# Patient Record
Sex: Female | Born: 1960 | Race: Black or African American | Hispanic: No | Marital: Married | Smoking: Current some day smoker
Health system: Southern US, Community
[De-identification: ages and names within clinical notes are randomized; demographics above are authoritative.]

## PROBLEM LIST (undated history)

## (undated) DIAGNOSIS — F329 Major depressive disorder, single episode, unspecified: Secondary | ICD-10-CM

## (undated) DIAGNOSIS — G629 Polyneuropathy, unspecified: Secondary | ICD-10-CM

## (undated) DIAGNOSIS — E119 Type 2 diabetes mellitus without complications: Secondary | ICD-10-CM

## (undated) DIAGNOSIS — F32A Depression, unspecified: Secondary | ICD-10-CM

## (undated) DIAGNOSIS — F419 Anxiety disorder, unspecified: Secondary | ICD-10-CM

## (undated) HISTORY — PX: BREAST LUMPECTOMY: SHX2

---

## 2011-08-07 ENCOUNTER — Other Ambulatory Visit (HOSPITAL_COMMUNITY): Payer: Self-pay | Admitting: Physician Assistant

## 2011-08-07 DIAGNOSIS — Z1231 Encounter for screening mammogram for malignant neoplasm of breast: Secondary | ICD-10-CM

## 2011-09-04 ENCOUNTER — Ambulatory Visit (HOSPITAL_COMMUNITY)
Admission: RE | Admit: 2011-09-04 | Discharge: 2011-09-04 | Disposition: A | Discharge status: Home / Self Care | Payer: Self-pay | Source: Ambulatory Visit | Attending: Physician Assistant | Admitting: Physician Assistant

## 2011-09-04 DIAGNOSIS — Z1231 Encounter for screening mammogram for malignant neoplasm of breast: Secondary | ICD-10-CM

## 2011-09-06 ENCOUNTER — Other Ambulatory Visit: Payer: Self-pay | Admitting: Physician Assistant

## 2011-09-06 DIAGNOSIS — R928 Other abnormal and inconclusive findings on diagnostic imaging of breast: Secondary | ICD-10-CM

## 2011-09-19 ENCOUNTER — Other Ambulatory Visit (HOSPITAL_COMMUNITY): Payer: Self-pay | Admitting: Family Medicine

## 2011-09-19 DIAGNOSIS — R928 Other abnormal and inconclusive findings on diagnostic imaging of breast: Secondary | ICD-10-CM

## 2011-09-26 ENCOUNTER — Other Ambulatory Visit (HOSPITAL_COMMUNITY): Payer: Self-pay | Admitting: Family Medicine

## 2011-09-26 ENCOUNTER — Ambulatory Visit (HOSPITAL_COMMUNITY)
Admission: RE | Admit: 2011-09-26 | Discharge: 2011-09-26 | Disposition: A | Discharge status: Home / Self Care | Payer: PRIVATE HEALTH INSURANCE | Source: Ambulatory Visit | Attending: Family Medicine | Admitting: Family Medicine

## 2011-09-26 ENCOUNTER — Ambulatory Visit (HOSPITAL_COMMUNITY)
Admission: RE | Admit: 2011-09-26 | Discharge: 2011-09-26 | Disposition: A | Discharge status: Home / Self Care | Payer: PRIVATE HEALTH INSURANCE | Source: Ambulatory Visit | Attending: Physician Assistant | Admitting: Physician Assistant

## 2011-09-26 DIAGNOSIS — R928 Other abnormal and inconclusive findings on diagnostic imaging of breast: Secondary | ICD-10-CM

## 2011-09-26 DIAGNOSIS — N63 Unspecified lump in unspecified breast: Secondary | ICD-10-CM | POA: Insufficient documentation

## 2012-03-07 ENCOUNTER — Other Ambulatory Visit (HOSPITAL_COMMUNITY): Payer: Self-pay | Admitting: Nurse Practitioner

## 2012-03-07 DIAGNOSIS — Z09 Encounter for follow-up examination after completed treatment for conditions other than malignant neoplasm: Secondary | ICD-10-CM

## 2012-04-02 ENCOUNTER — Ambulatory Visit (HOSPITAL_COMMUNITY)
Admission: RE | Admit: 2012-04-02 | Discharge: 2012-04-02 | Disposition: A | Discharge status: Home / Self Care | Payer: PRIVATE HEALTH INSURANCE | Source: Ambulatory Visit | Attending: Nurse Practitioner | Admitting: Nurse Practitioner

## 2012-04-02 DIAGNOSIS — Z09 Encounter for follow-up examination after completed treatment for conditions other than malignant neoplasm: Secondary | ICD-10-CM

## 2012-04-02 DIAGNOSIS — N63 Unspecified lump in unspecified breast: Secondary | ICD-10-CM | POA: Insufficient documentation

## 2012-12-10 IMAGING — US US BREAST*R*
1 series · 4 of 4 positions shown · non-contrast
Comparison: baseline screening mammogram 09/04/2011

CLINICAL DATA: Mass right breast identified on recent baseline
screening mammogram.

DIGITAL DIAGNOSTIC RIGHT MAMMOGRAM WITHOUT CAD AND RIGHT BREAST
ULTRASOUND:

[Series 1: us breast*right* · 0.08mm/px · 4 of 4 slices shown]
[im 1/4]
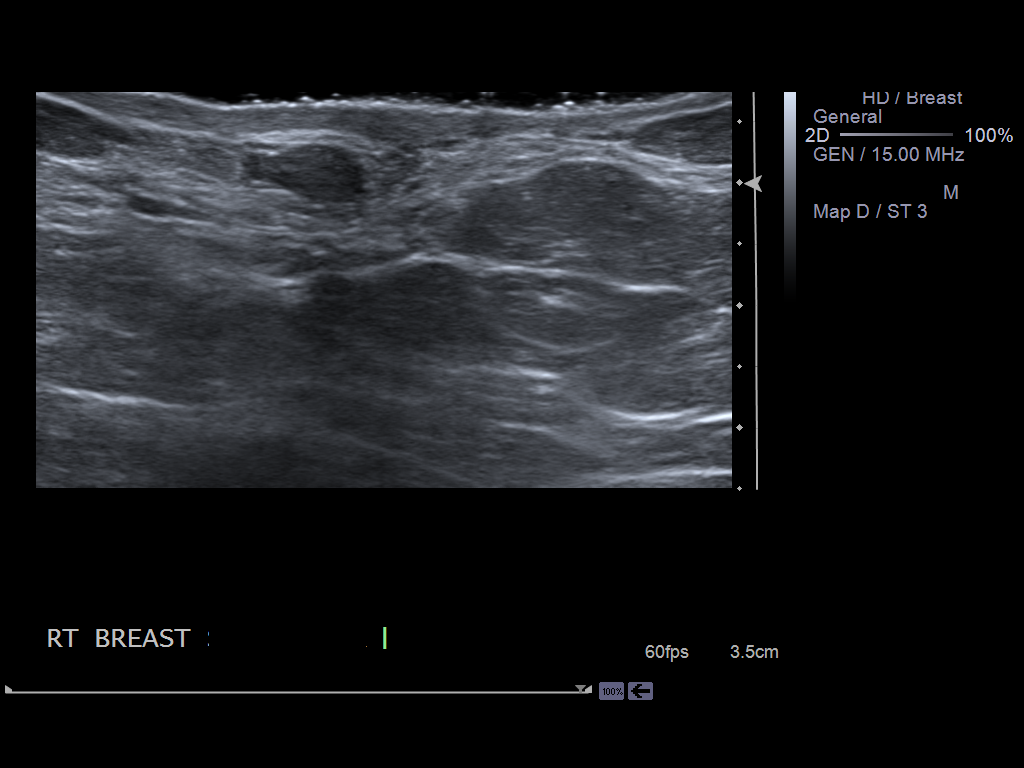
[im 2/4]
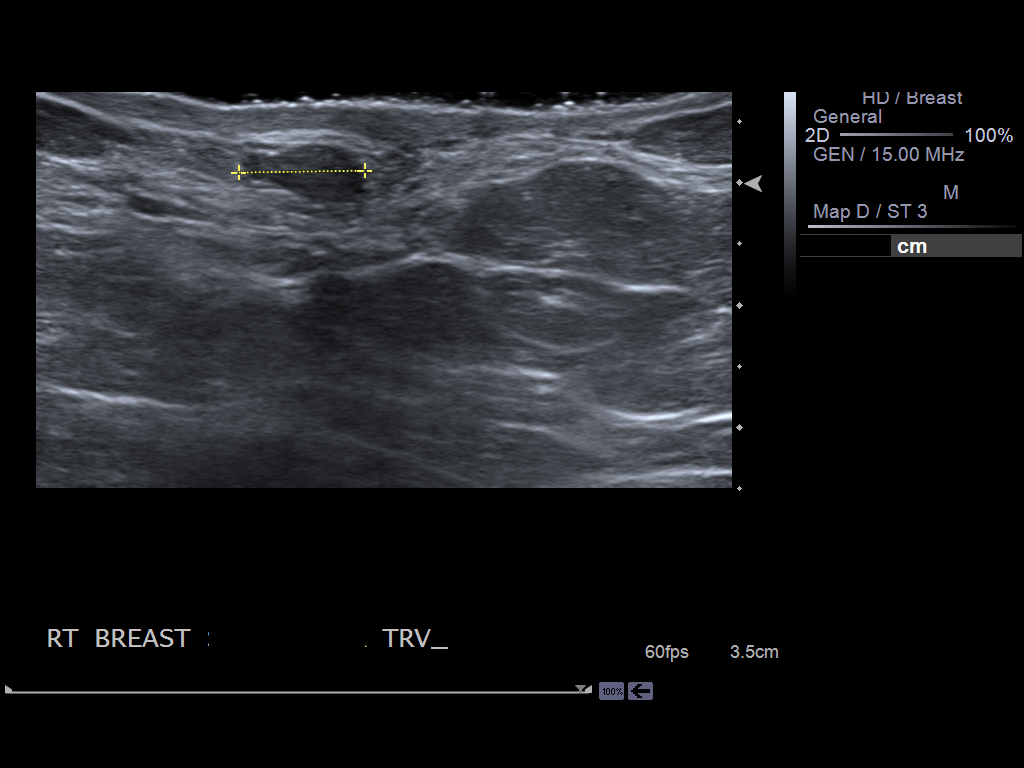
[im 3/4]
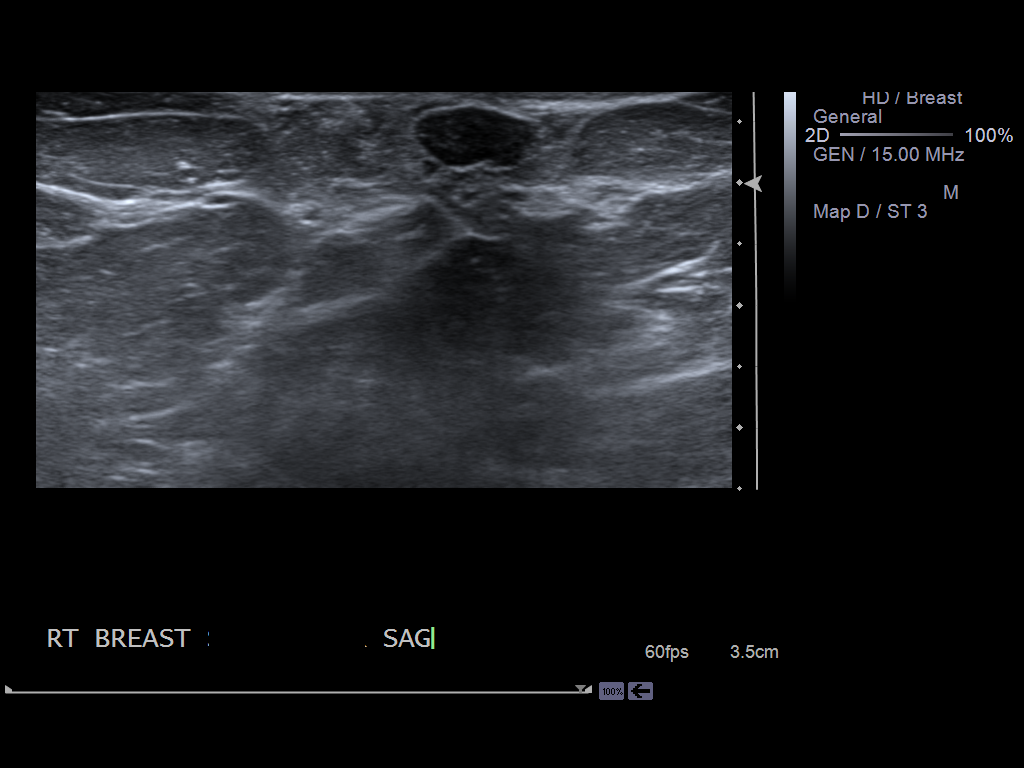
[im 4/4]
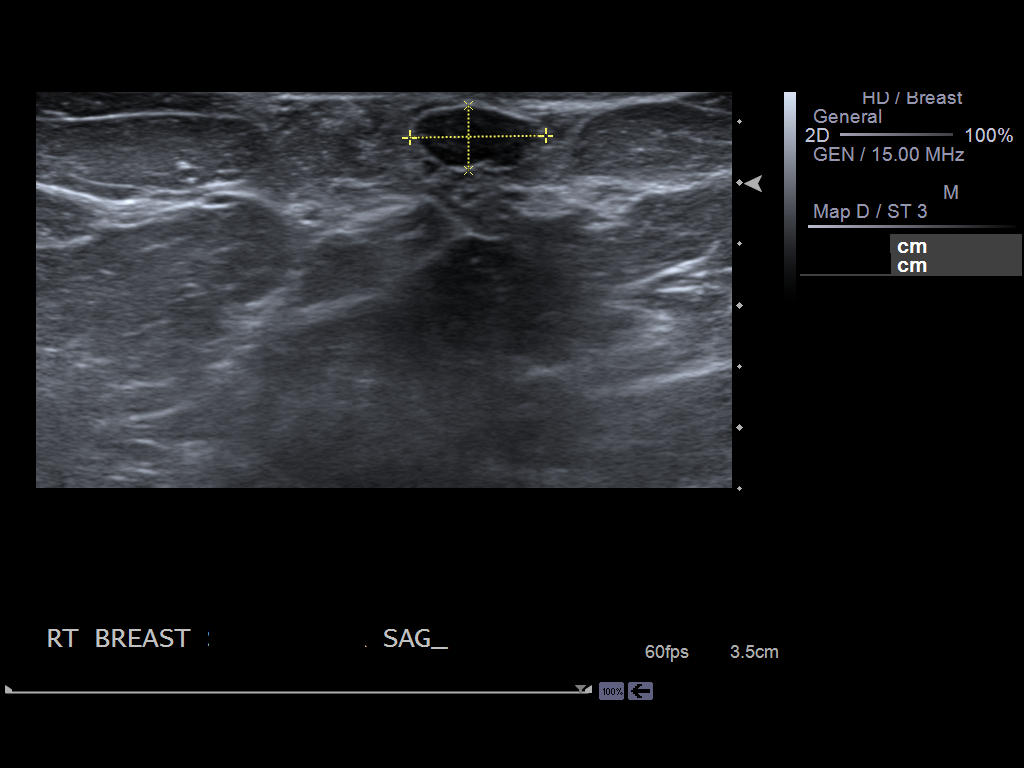

[4 of 4 positions shown; findings below may reference images not displayed]

FINDINGS: Focal spot compression views of the subareolar right
breast show a circumscribed 10 mm nodule.

On physical exam, no mass is palpated in the subareolar right
breast.

Ultrasound is performed, showing a circumscribed oval and
macrolobulated subareolar hypoechoic solid breast mass that
measures 1.0 x 1.1 x 0.5 cm.  This is likely a benign fibroadenoma.
IMPRESSION: Probable fibroadenoma subareolar right breast.  Follow-up right
breast ultrasound in 6 months is recommended.

BI-RADS CATEGORY 3:  Probably benign finding(s) - short interval
follow-up suggested.

## 2014-03-12 ENCOUNTER — Encounter (HOSPITAL_COMMUNITY): Payer: Self-pay | Admitting: Emergency Medicine

## 2014-03-12 ENCOUNTER — Emergency Department (HOSPITAL_COMMUNITY)
Admission: EM | Admit: 2014-03-12 | Discharge: 2014-03-13 | Disposition: A | Discharge status: Other Acute Inpatient Hospital | Payer: Self-pay | Attending: Emergency Medicine | Admitting: Emergency Medicine

## 2014-03-12 DIAGNOSIS — Z8669 Personal history of other diseases of the nervous system and sense organs: Secondary | ICD-10-CM | POA: Insufficient documentation

## 2014-03-12 DIAGNOSIS — F329 Major depressive disorder, single episode, unspecified: Secondary | ICD-10-CM | POA: Insufficient documentation

## 2014-03-12 DIAGNOSIS — Z720 Tobacco use: Secondary | ICD-10-CM | POA: Insufficient documentation

## 2014-03-12 DIAGNOSIS — F32A Depression, unspecified: Secondary | ICD-10-CM

## 2014-03-12 DIAGNOSIS — E119 Type 2 diabetes mellitus without complications: Secondary | ICD-10-CM | POA: Insufficient documentation

## 2014-03-12 HISTORY — DX: Anxiety disorder, unspecified: F41.9

## 2014-03-12 HISTORY — DX: Depression, unspecified: F32.A

## 2014-03-12 HISTORY — DX: Major depressive disorder, single episode, unspecified: F32.9

## 2014-03-12 HISTORY — DX: Polyneuropathy, unspecified: G62.9

## 2014-03-12 HISTORY — DX: Type 2 diabetes mellitus without complications: E11.9

## 2014-03-12 LAB — RAPID URINE DRUG SCREEN, HOSP PERFORMED
Amphetamines: NOT DETECTED
Barbiturates: NOT DETECTED
Benzodiazepines: NOT DETECTED
Cocaine: NOT DETECTED
Opiates: NOT DETECTED
Tetrahydrocannabinol: NOT DETECTED

## 2014-03-12 LAB — BASIC METABOLIC PANEL
Anion gap: 21 — ABNORMAL HIGH (ref 5–15)
BUN: 13 mg/dL (ref 6–23)
CO2: 20 mEq/L (ref 19–32)
Calcium: 9.7 mg/dL (ref 8.4–10.5)
Chloride: 95 mEq/L — ABNORMAL LOW (ref 96–112)
Creatinine, Ser: 0.95 mg/dL (ref 0.50–1.10)
GFR calc Af Amer: 77 mL/min — ABNORMAL LOW (ref >90)
GFR calc non Af Amer: 66 mL/min — ABNORMAL LOW (ref >90)
Glucose, Bld: 71 mg/dL (ref 70–99)
Potassium: 3.4 mEq/L — ABNORMAL LOW (ref 3.7–5.3)
Sodium: 136 mEq/L — ABNORMAL LOW (ref 137–147)

## 2014-03-12 LAB — CBC WITH DIFFERENTIAL/PLATELET
Basophils Absolute: 0.1 10*3/uL (ref 0.0–0.1)
Basophils Relative: 0 % (ref 0–1)
Eosinophils Absolute: 0.1 10*3/uL (ref 0.0–0.7)
Eosinophils Relative: 1 % (ref 0–5)
HCT: 43 % (ref 36.0–46.0)
Hemoglobin: 14.9 g/dL (ref 12.0–15.0)
Lymphocytes Relative: 44 % (ref 12–46)
Lymphs Abs: 6.2 10*3/uL — ABNORMAL HIGH (ref 0.7–4.0)
MCH: 34.7 pg — ABNORMAL HIGH (ref 26.0–34.0)
MCHC: 34.7 g/dL (ref 30.0–36.0)
MCV: 100.2 fL — ABNORMAL HIGH (ref 78.0–100.0)
Monocytes Absolute: 0.7 10*3/uL (ref 0.1–1.0)
Monocytes Relative: 5 % (ref 3–12)
Neutro Abs: 6.9 10*3/uL (ref 1.7–7.7)
Neutrophils Relative %: 49 % (ref 43–77)
Platelets: 230 10*3/uL (ref 150–400)
RBC: 4.29 MIL/uL (ref 3.87–5.11)
RDW: 14.4 % (ref 11.5–15.5)
WBC: 13.9 10*3/uL — ABNORMAL HIGH (ref 4.0–10.5)

## 2014-03-12 LAB — URINALYSIS, ROUTINE W REFLEX MICROSCOPIC
Bilirubin Urine: NEGATIVE
Glucose, UA: NEGATIVE mg/dL
Ketones, ur: NEGATIVE mg/dL
Nitrite: NEGATIVE
Protein, ur: NEGATIVE mg/dL
Specific Gravity, Urine: 1.01 (ref 1.005–1.030)
Urobilinogen, UA: 0.2 mg/dL (ref 0.0–1.0)
pH: 5.5 (ref 5.0–8.0)

## 2014-03-12 LAB — URINE MICROSCOPIC-ADD ON

## 2014-03-12 LAB — ETHANOL: Alcohol, Ethyl (B): 197 mg/dL — ABNORMAL HIGH (ref 0–11)

## 2014-03-12 MED ORDER — ACETAMINOPHEN 325 MG PO TABS
650.0000 mg | ORAL_TABLET | ORAL | Status: DC | PRN
  Administered 2014-03-13: 650 mg via ORAL
  Filled 2014-03-12: qty 2

## 2014-03-12 MED ORDER — ONDANSETRON HCL 4 MG PO TABS: 4.0000 mg | ORAL_TABLET | Freq: Three times a day (TID) | ORAL | Status: DC | PRN

## 2014-03-12 MED ORDER — ALUM & MAG HYDROXIDE-SIMETH 200-200-20 MG/5ML PO SUSP: 30.0000 mL | ORAL | Status: DC | PRN

## 2014-03-12 MED ORDER — NICOTINE 21 MG/24HR TD PT24
21.0000 mg | MEDICATED_PATCH | Freq: Every day | TRANSDERMAL | Status: DC
  Administered 2014-03-12 – 2014-03-13 (×2): 21 mg via TRANSDERMAL
  Filled 2014-03-12 (×2): qty 1

## 2014-03-12 MED ORDER — IBUPROFEN 400 MG PO TABS: 400.0000 mg | ORAL_TABLET | Freq: Three times a day (TID) | ORAL | Status: DC | PRN

## 2014-03-12 MED ORDER — LORAZEPAM 1 MG PO TABS
1.0000 mg | ORAL_TABLET | Freq: Three times a day (TID) | ORAL | Status: DC | PRN
  Administered 2014-03-12: 1 mg via ORAL
  Filled 2014-03-12: qty 1

## 2014-03-12 NOTE — ED Provider Notes (Signed)
CSN: [id number]     Arrival date & time 03/12/14  2058 History   First MD Initiated Contact with Patient 03/12/14 2115     Chief Complaint  Patient presents with  . V70.1     HPI Pt was seen at 2120.  Per pt, c/o gradual onset and worsening of persistent depression for the past several months, worse over the past several days. Pt states she had thoughts of harming herself tonight but denies having a plan. States she has been taking meds, as well as been evaluated at [facility], for depression/anxiety. Denies HI, no hallucinations.   Past Medical History  Diagnosis Date  . Diabetes mellitus without complication   . Neuropathy   . Depression   . Anxiety    History reviewed. No pertinent past surgical history.  History  Substance Use Topics  . Smoking status: Current Every Day Smoker  . Smokeless tobacco: Not on file  . Alcohol Use: Yes    Review of Systems ROS: Statement: All systems negative except as marked or noted in the HPI; Constitutional: Negative for fever and chills. ; ; Eyes: Negative for eye pain, redness and discharge. ; ; ENMT: Negative for ear pain, hoarseness, nasal congestion, sinus pressure and sore throat. ; ; Cardiovascular: Negative for chest pain, palpitations, diaphoresis, dyspnea and peripheral edema. ; ; Respiratory: Negative for cough, wheezing and stridor. ; ; Gastrointestinal: Negative for nausea, vomiting, diarrhea, abdominal pain, blood in stool, hematemesis, jaundice and rectal bleeding. . ; ; Genitourinary: Negative for dysuria, flank pain and hematuria. ; ; Musculoskeletal: Negative for back pain and neck pain. Negative for swelling and trauma.; ; Skin: Negative for pruritus, rash, abrasions, blisters, bruising and skin lesion.; ; Neuro: Negative for headache, lightheadedness and neck stiffness. Negative for weakness, altered level of consciousness , altered mental status, extremity weakness, paresthesias, involuntary movement, seizure and syncope.; Psych:   +depression, vague SI. No SA, no HI, no hallucinations.     Allergies  Review of patient's allergies indicates no known allergies.  Home Medications   Prior to Admission medications   Not on File   Pulse 108  Temp(Src) 98.2 F (36.8 C) (Oral)  Resp 24  Ht 5\' 6"  (1.676 m)  Wt 214 lb (97.07 kg)  BMI 34.56 kg/m2  SpO2 98% Physical Exam 2125:  Physical examination:  Nursing notes reviewed; Vital signs and O2 SAT reviewed;  Constitutional: Well developed, Well nourished, Well hydrated, In no acute distress; Head:  Normocephalic, atraumatic; Eyes: EOMI, PERRL, No scleral icterus; ENMT: Mouth and pharynx normal, Mucous membranes moist; Neck: Supple, Full range of motion, No lymphadenopathy; Cardiovascular: Regular rate and rhythm, No murmur, rub, or gallop; Respiratory: Breath sounds clear & equal bilaterally, No rales, rhonchi, wheezes.  Speaking full sentences with ease, Normal respiratory effort/excursion; Chest: Nontender, Movement normal; Abdomen: Soft, Nontender, Nondistended, Normal bowel sounds;; Extremities: Pulses normal, No tenderness, No edema, No calf edema or asymmetry.; Neuro: AA&Ox3, Major CN grossly intact.  Speech clear. No gross focal motor or sensory deficits in extremities. Climbs on and off stretcher easily by herself. Gait steady.; Skin: Color normal, Warm, Dry.; Psych:  Affect flat, poor eye contact, rambling historian.    ED Course  Procedures   MDM  MDM Reviewed: previous chart, nursing note and vitals    2155:  Labs and TTS eval pending. Holding orders written.   Francine Graven, DO 03/12/14 2155

## 2014-03-12 NOTE — BH Assessment (Signed)
Received call for assessment. Spoke with Dr. Nat Christen who said Pt reports depression with suicidal ideation and no plan. Tele-assessment will be initiated. Nursing staff reports Pt needs to be relocated and will be ready in approximately 10 minutes.  Orpah Greek Rosana Hoes, [facility] Triage Specialist [phone]
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
## 2014-03-12 NOTE — ED Notes (Signed)
Pt states she is depressed and tired of feeling this way pt has had thoughts of harming herself tonight but states she doesn't have a plan. Pt states she is tired of the "drama" in her life. Pt has been drinking tonight.

## 2014-03-12 NOTE — BH Assessment (Signed)
Assessment complete. Per Lavell Luster, [facility], there is not an appropriate bed available at this time but a bed will be available in the morning. Gave clinical report to Waylan Boga, NP who agrees Pt meets criteria for inpatient treatment and accepts Pt to the service of Dr. Ursula Alert. Notified Dr. Nat Christen of acceptance and bed situation. Notified Ivin Booty, RN of disposition.  Orpah Greek Rosana Hoes, [facility] Triage Specialist [phone]

## 2014-03-13 ENCOUNTER — Encounter (HOSPITAL_COMMUNITY): Payer: Self-pay | Admitting: *Deleted

## 2014-03-13 ENCOUNTER — Inpatient Hospital Stay (HOSPITAL_COMMUNITY)
Admission: AD | Admit: 2014-03-13 | Discharge: 2014-03-16 | DRG: 885 | Disposition: A | Discharge status: Home / Self Care | Payer: 59 | Source: Intra-hospital | Attending: Psychiatry | Admitting: Psychiatry

## 2014-03-13 DIAGNOSIS — F1010 Alcohol abuse, uncomplicated: Secondary | ICD-10-CM | POA: Diagnosis present

## 2014-03-13 DIAGNOSIS — E119 Type 2 diabetes mellitus without complications: Secondary | ICD-10-CM | POA: Diagnosis present

## 2014-03-13 DIAGNOSIS — F332 Major depressive disorder, recurrent severe without psychotic features: Secondary | ICD-10-CM | POA: Diagnosis present

## 2014-03-13 DIAGNOSIS — F4310 Post-traumatic stress disorder, unspecified: Secondary | ICD-10-CM | POA: Diagnosis present

## 2014-03-13 DIAGNOSIS — I10 Essential (primary) hypertension: Secondary | ICD-10-CM | POA: Diagnosis present

## 2014-03-13 DIAGNOSIS — Z599 Problem related to housing and economic circumstances, unspecified: Secondary | ICD-10-CM

## 2014-03-13 DIAGNOSIS — F17210 Nicotine dependence, cigarettes, uncomplicated: Secondary | ICD-10-CM | POA: Diagnosis present

## 2014-03-13 DIAGNOSIS — R45851 Suicidal ideations: Secondary | ICD-10-CM | POA: Diagnosis present

## 2014-03-13 DIAGNOSIS — Z559 Problems related to education and literacy, unspecified: Secondary | ICD-10-CM | POA: Diagnosis present

## 2014-03-13 DIAGNOSIS — G4700 Insomnia, unspecified: Secondary | ICD-10-CM | POA: Diagnosis present

## 2014-03-13 DIAGNOSIS — F330 Major depressive disorder, recurrent, mild: Secondary | ICD-10-CM

## 2014-03-13 DIAGNOSIS — F17200 Nicotine dependence, unspecified, uncomplicated: Secondary | ICD-10-CM

## 2014-03-13 DIAGNOSIS — G629 Polyneuropathy, unspecified: Secondary | ICD-10-CM | POA: Diagnosis present

## 2014-03-13 DIAGNOSIS — N390 Urinary tract infection, site not specified: Secondary | ICD-10-CM | POA: Diagnosis present

## 2014-03-13 DIAGNOSIS — Z91410 Personal history of adult physical and sexual abuse: Secondary | ICD-10-CM

## 2014-03-13 DIAGNOSIS — F329 Major depressive disorder, single episode, unspecified: Secondary | ICD-10-CM | POA: Diagnosis present

## 2014-03-13 DIAGNOSIS — F32A Depression, unspecified: Secondary | ICD-10-CM | POA: Diagnosis present

## 2014-03-13 DIAGNOSIS — F411 Generalized anxiety disorder: Secondary | ICD-10-CM | POA: Diagnosis present

## 2014-03-13 DIAGNOSIS — G8929 Other chronic pain: Secondary | ICD-10-CM | POA: Diagnosis present

## 2014-03-13 LAB — CBG MONITORING, ED
Glucose-Capillary: 100 mg/dL — ABNORMAL HIGH (ref 70–99)
Glucose-Capillary: 149 mg/dL — ABNORMAL HIGH (ref 70–99)

## 2014-03-13 LAB — GLUCOSE, CAPILLARY
Glucose-Capillary: 162 mg/dL — ABNORMAL HIGH (ref 70–99)
Glucose-Capillary: 118 mg/dL — ABNORMAL HIGH (ref 70–99)
Glucose-Capillary: 158 mg/dL — ABNORMAL HIGH (ref 70–99)

## 2014-03-13 MED ORDER — VITAMIN B-1 100 MG PO TABS
100.0000 mg | ORAL_TABLET | Freq: Every day | ORAL | Status: DC
  Administered 2014-03-14 – 2014-03-16 (×3): 100 mg via ORAL
  Filled 2014-03-13 (×5): qty 1

## 2014-03-13 MED ORDER — ROSUVASTATIN CALCIUM 20 MG PO TABS
20.0000 mg | ORAL_TABLET | Freq: Every day | ORAL | Status: DC
  Administered 2014-03-13 – 2014-03-15 (×3): 20 mg via ORAL
  Filled 2014-03-13 (×6): qty 1

## 2014-03-13 MED ORDER — ASPIRIN EC 325 MG PO TBEC
325.0000 mg | DELAYED_RELEASE_TABLET | Freq: Every day | ORAL | Status: DC
Start: 2014-03-13 — End: 2014-03-13
  Administered 2014-03-13: 325 mg via ORAL
  Filled 2014-03-13: qty 1

## 2014-03-13 MED ORDER — CEPHALEXIN 500 MG PO CAPS
500.0000 mg | ORAL_CAPSULE | Freq: Two times a day (BID) | ORAL | Status: DC
  Administered 2014-03-13 (×2): 500 mg via ORAL
  Filled 2014-03-13 (×2): qty 1

## 2014-03-13 MED ORDER — CHLORDIAZEPOXIDE HCL 25 MG PO CAPS: 25.0000 mg | ORAL_CAPSULE | Freq: Four times a day (QID) | ORAL | Status: AC | PRN

## 2014-03-13 MED ORDER — INSULIN GLARGINE 100 UNIT/ML ~~LOC~~ SOLN
20.0000 [IU] | Freq: Every day | SUBCUTANEOUS | Status: DC
  Administered 2014-03-13 – 2014-03-15 (×3): 20 [IU] via SUBCUTANEOUS

## 2014-03-13 MED ORDER — METFORMIN HCL 500 MG PO TABS
1000.0000 mg | ORAL_TABLET | Freq: Two times a day (BID) | ORAL | Status: DC
  Administered 2014-03-13 – 2014-03-16 (×6): 1000 mg via ORAL
  Filled 2014-03-13 (×10): qty 2

## 2014-03-13 MED ORDER — ONDANSETRON 4 MG PO TBDP: 4.0000 mg | ORAL_TABLET | Freq: Four times a day (QID) | ORAL | Status: AC | PRN

## 2014-03-13 MED ORDER — GLIPIZIDE ER 10 MG PO TB24
10.0000 mg | ORAL_TABLET | Freq: Every day | ORAL | Status: DC
  Administered 2014-03-14 – 2014-03-16 (×3): 10 mg via ORAL
  Filled 2014-03-13 (×5): qty 1

## 2014-03-13 MED ORDER — HYDROCHLOROTHIAZIDE 25 MG PO TABS
25.0000 mg | ORAL_TABLET | Freq: Every day | ORAL | Status: DC
  Filled 2014-03-13 (×2): qty 1

## 2014-03-13 MED ORDER — GABAPENTIN 400 MG PO CAPS
1200.0000 mg | ORAL_CAPSULE | Freq: Two times a day (BID) | ORAL | Status: DC
  Administered 2014-03-13 – 2014-03-16 (×6): 1200 mg via ORAL
  Filled 2014-03-13 (×10): qty 3

## 2014-03-13 MED ORDER — ROSUVASTATIN CALCIUM 20 MG PO TABS
20.0000 mg | ORAL_TABLET | Freq: Every day | ORAL | Status: DC
  Filled 2014-03-13: qty 1

## 2014-03-13 MED ORDER — HYDROCHLOROTHIAZIDE 25 MG PO TABS
25.0000 mg | ORAL_TABLET | Freq: Every day | ORAL | Status: DC
  Administered 2014-03-14 – 2014-03-16 (×3): 25 mg via ORAL
  Filled 2014-03-13 (×5): qty 1

## 2014-03-13 MED ORDER — ASPIRIN EC 325 MG PO TBEC
325.0000 mg | DELAYED_RELEASE_TABLET | Freq: Every day | ORAL | Status: DC
  Administered 2014-03-14 – 2014-03-16 (×3): 325 mg via ORAL
  Filled 2014-03-13 (×5): qty 1

## 2014-03-13 MED ORDER — CIPROFLOXACIN HCL 500 MG PO TABS
500.0000 mg | ORAL_TABLET | Freq: Two times a day (BID) | ORAL | Status: DC
  Administered 2014-03-13 – 2014-03-16 (×7): 500 mg via ORAL
  Filled 2014-03-13 (×10): qty 1
  Filled 2014-03-13: qty 2

## 2014-03-13 MED ORDER — MAGNESIUM HYDROXIDE 400 MG/5ML PO SUSP: 30.0000 mL | Freq: Every day | ORAL | Status: DC | PRN

## 2014-03-13 MED ORDER — ALUM & MAG HYDROXIDE-SIMETH 200-200-20 MG/5ML PO SUSP: 30.0000 mL | ORAL | Status: DC | PRN

## 2014-03-13 MED ORDER — LORAZEPAM 1 MG PO TABS
2.0000 mg | ORAL_TABLET | Freq: Once | ORAL | Status: AC
  Administered 2014-03-13: 2 mg via ORAL
  Filled 2014-03-13: qty 2

## 2014-03-13 MED ORDER — ACETAMINOPHEN 325 MG PO TABS: 650.0000 mg | ORAL_TABLET | Freq: Four times a day (QID) | ORAL | Status: DC | PRN

## 2014-03-13 MED ORDER — HYDROXYZINE HCL 25 MG PO TABS: 25.0000 mg | ORAL_TABLET | Freq: Four times a day (QID) | ORAL | Status: AC | PRN

## 2014-03-13 MED ORDER — NICOTINE POLACRILEX 2 MG MT GUM
2.0000 mg | CHEWING_GUM | OROMUCOSAL | Status: DC | PRN
  Filled 2014-03-13: qty 1

## 2014-03-13 MED ORDER — METOPROLOL TARTRATE 25 MG PO TABS
25.0000 mg | ORAL_TABLET | Freq: Two times a day (BID) | ORAL | Status: DC
  Administered 2014-03-13 – 2014-03-16 (×6): 25 mg via ORAL
  Filled 2014-03-13 (×10): qty 1

## 2014-03-13 MED ORDER — LOPERAMIDE HCL 2 MG PO CAPS: 2.0000 mg | ORAL_CAPSULE | ORAL | Status: AC | PRN

## 2014-03-13 MED ORDER — METFORMIN HCL 500 MG PO TABS
1000.0000 mg | ORAL_TABLET | Freq: Two times a day (BID) | ORAL | Status: DC
  Administered 2014-03-13: 1000 mg via ORAL
  Filled 2014-03-13 (×2): qty 2

## 2014-03-13 MED ORDER — METOPROLOL TARTRATE 25 MG PO TABS
25.0000 mg | ORAL_TABLET | Freq: Two times a day (BID) | ORAL | Status: DC
  Administered 2014-03-13: 25 mg via ORAL
  Filled 2014-03-13: qty 1

## 2014-03-13 MED ORDER — ACETAMINOPHEN 325 MG PO TABS
650.0000 mg | ORAL_TABLET | Freq: Four times a day (QID) | ORAL | Status: DC | PRN
Start: 2014-03-13 — End: 2014-03-13

## 2014-03-13 MED ORDER — ADULT MULTIVITAMIN W/MINERALS CH
1.0000 | ORAL_TABLET | Freq: Every day | ORAL | Status: DC
  Administered 2014-03-13 – 2014-03-16 (×4): 1 via ORAL
  Filled 2014-03-13 (×2): qty 1
  Filled 2014-03-13: qty 14
  Filled 2014-03-13 (×4): qty 1

## 2014-03-13 NOTE — Plan of Care (Signed)
Problem: Diagnosis: Increased Risk For Suicide Attempt Goal: STG-Patient Will Comply With Medication Regime Outcome: Progressing Patient is compliant with scheduled medications.     
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
## 2014-03-13 NOTE — ED Notes (Signed)
Destination on discharge charted in error. Pt did not go home/self care. Pt transferred to [facility].

## 2014-03-13 NOTE — [facility] Group Notes (Signed)
[facility] Group Notes:  (Nursing/MHT/Case Management/Adjunct)  Date:  03/13/2014  Time: 0900 am  Type of Therapy:  Psychoeducational Skills  Participation Level:  Did Not Attend   Natalie Vaughan 03/13/2014, 3:16 PM

## 2014-03-13 NOTE — Progress Notes (Signed)
[facility] Group Notes:  (Nursing/MHT/Case Management/Adjunct)  Date:  03/13/2014  Time:  9:50 PM  Type of Therapy:  Psychoeducational Skills  Participation Level:  Active  Participation Quality:  Appropriate  Affect:  Flat  Cognitive:  Appropriate  Insight:  Appropriate  Engagement in Group:  Supportive  Modes of Intervention:  Education  Summary of Progress/Problems: The patient shared in group that today was her first day in the hospital and that she was able to call her husband. She mentioned briefly that her husband is currently in the [place] But would not expound any further. As a theme for the day,her coping skill is to go fishing and to cook.   Wynonia Medero S 03/13/2014, 9:50 PM

## 2014-03-13 NOTE — ED Notes (Signed)
Per 8am bridge call, pt has a bed that is being cleaned. Pt going to 505-2 at [facility]. Attempted to call report several times with no answer.

## 2014-03-13 NOTE — Tx Team (Signed)
Initial Interdisciplinary Treatment Plan   PATIENT STRESSORS: Financial difficulties Substance abuse   PROBLEM LIST: Problem List/Patient Goals Date to be addressed Date deferred Reason deferred Estimated date of resolution  Substance abuse 03/13/14           Depression 03/13/14                                          DISCHARGE CRITERIA:  Improved stabilization in mood, thinking, and/or behavior Need for constant or close observation no longer present  PRELIMINARY DISCHARGE PLAN: Return to previous living arrangement  PATIENT/FAMIILY INVOLVEMENT: This treatment plan has been presented to and reviewed with the patient, Natalie Vaughan, and/or family member.  The patient and family have been given the opportunity to ask questions and make suggestions.  [name] 03/13/2014, 11:55 AM

## 2014-03-13 NOTE — Plan of Care (Signed)
Problem: Diagnosis: Increased Risk For Suicide Attempt Goal: STG-Patient Will Attend All Groups On The Unit Outcome: Progressing Patient attended wrap up group this evening.

## 2014-03-13 NOTE — ED Notes (Signed)
Verbal report given to Vira Browns, RN. [place]. All questions answered.

## 2014-03-13 NOTE — ED Notes (Signed)
[facility] transport here. Pt in bathroom, washing face and performing ADL's. nad noted.

## 2014-03-13 NOTE — Progress Notes (Signed)
Writer observed patient sitting in the dayroom watching tv with no interaction with peers. Writer introduced self to her and informed her of her medications scheduled for tonight. Patient voiced no complaints and is agreeable to taking her scheduled medications. She currently denies si/hi/a/v hallucinations. Support and encouragement given, safety maintained on unit with 15 min checks.

## 2014-03-13 NOTE — ED Provider Notes (Signed)
Patient accepted by [facility]. Patient is on a involuntary hold. Excepting physician is Eappen.  Fredia Sorrow, MD 03/13/14 [phone]

## 2014-03-14 DIAGNOSIS — F4310 Post-traumatic stress disorder, unspecified: Secondary | ICD-10-CM

## 2014-03-14 DIAGNOSIS — F332 Major depressive disorder, recurrent severe without psychotic features: Principal | ICD-10-CM

## 2014-03-14 DIAGNOSIS — F1010 Alcohol abuse, uncomplicated: Secondary | ICD-10-CM

## 2014-03-14 LAB — GLUCOSE, CAPILLARY
Glucose-Capillary: 148 mg/dL — ABNORMAL HIGH (ref 70–99)
Glucose-Capillary: 161 mg/dL — ABNORMAL HIGH (ref 70–99)
Glucose-Capillary: 172 mg/dL — ABNORMAL HIGH (ref 70–99)
Glucose-Capillary: 170 mg/dL — ABNORMAL HIGH (ref 70–99)

## 2014-03-14 MED ORDER — NICOTINE 21 MG/24HR TD PT24
21.0000 mg | MEDICATED_PATCH | Freq: Every day | TRANSDERMAL | Status: DC
  Administered 2014-03-14 – 2014-03-16 (×3): 21 mg via TRANSDERMAL
  Filled 2014-03-14 (×4): qty 1
  Filled 2014-03-14: qty 14

## 2014-03-14 MED ORDER — NICOTINE 21 MG/24HR TD PT24
MEDICATED_PATCH | TRANSDERMAL | Status: AC
  Administered 2014-03-14: 21 mg via TRANSDERMAL
  Filled 2014-03-14: qty 1

## 2014-03-14 NOTE — Progress Notes (Signed)
Patient ID: Natalie Vaughan, female   DOB: 1960-12-18, 52 y.o.   MRN: [id number] D. Pt presents with depressed mood, affect congruent. [name] reports that she is ''feeling better now that I am changing things to get better. '' Pt completed self inventory and rates her depression 2/10 on depression scale. She denies any si. She did request to sign 72 hour stating '' i want to be sure I can get out of here in three days. i don't want to be stuck here. A. Medications given as ordered. Support encouragement provided. Pt signed 72 hr form. R. Patient is safe. No further voiced concerns. Will continue to monitor q 15 minutes for safety.
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
## 2014-03-14 NOTE — BHH Group Notes (Signed)
[facility] Group Notes:  Self inventory  Date:  03/14/2014  Time:  10:24 AM  Type of Therapy:  Nurse Education  Participation Level:  Active  Participation Quality:  Appropriate  Affect:  Appropriate  Cognitive:  Alert  Insight:  Appropriate  Engagement in Group:  Engaged  Modes of Intervention:  Discussion  Summary of Progress/Problems:  Delman Kitten 03/14/2014, 10:24 AM

## 2014-03-14 NOTE — BHH Group Notes (Signed)
[place] Group Notes:  (Clinical Social Work)  03/14/2014   11:15am-12:00pm  Summary of Progress/Problems:  The main focus of today's process group was to listen to a variety of genres of music and to identify that different types of music provoke different responses.  The patient then was able to identify personally what was soothing for them, as well as energizing.   The patient expressed understanding of concepts, as well as knowledge of how each type of music affected her.  She seemed to be enjoying the music pieces, but did leave the group prior to the end and did not return.  She stated her prevailing feeling at the beginning of group was Depression, at 3 out of 10.  Type of Therapy:  Music Therapy   Participation Level:  Active  Participation Quality:  Attentive and Sharing  Affect:  Blunted  Cognitive:  Oriented  Insight:  Engaged  Engagement in Therapy:  Engaged  Modes of Intervention:   Activity, Exploration  Selmer Dominion, LCSW 03/14/2014, 12:30pm
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
## 2014-03-14 NOTE — BHH Suicide Risk Assessment (Signed)
Suicide Risk Assessment  Admission Assessment     Nursing information obtained from:  Patient Demographic factors:  Low socioeconomic status Current Mental Status:  Suicidal ideation indicated by patient Loss Factors:  Financial problems / change in socioeconomic status Historical Factors:  Prior suicide attempts Risk Reduction Factors:  Sense of responsibility to family Total Time spent with patient: 1 hour  CLINICAL FACTORS:   Depression:   Anhedonia Comorbid alcohol abuse/dependence Hopelessness Impulsivity Dysthymia Alcohol/Substance Abuse/Dependencies  Psychiatric Specialty Exam:     Blood pressure 114/73, pulse 88, temperature 98.4 F (36.9 C), temperature source Oral, resp. rate 16, height 5\' 6"  (1.676 m), weight 215 lb (97.523 kg).Body mass index is 34.72 kg/(m^2).  General Appearance: Casual  Eye Contact::  Fair  Speech:  Slow  Volume:  Decreased  Mood:  Dysphoric  Affect:  Congruent  Thought Process:  Coherent  Orientation:  Full (Time, Place, and Person)  Thought Content:  Rumination  Suicidal Thoughts:  Yes.  without intent/plan  Homicidal Thoughts:  No  Memory:  Immediate;   Fair Recent;   Fair  Judgement:  Poor  Insight:  Shallow  Psychomotor Activity:  Decreased  Concentration:  Fair  Recall:  [name]: Fair  Akathisia:  Negative  Handed:  Right  AIMS (if indicated):     Assets:  Desire for Improvement Leisure Time  Sleep:  Number of Hours: 6.25   Musculoskeletal: Strength & Muscle Tone: within normal limits Gait & Station: normal Patient leans: N/A  COGNITIVE FEATURES THAT CONTRIBUTE TO RISK:  Closed-mindedness Polarized thinking    SUICIDE RISK:   Moderate:  Frequent suicidal ideation with limited intensity, and duration, some specificity in terms of plans, no associated intent, good self-control, limited dysphoria/symptomatology, some risk factors present, and identifiable protective factors, including  available and accessible social support.  PLAN OF CARE:  I certify that inpatient services furnished can reasonably be expected to improve the patient's condition.  Natalie Vaughan 03/14/2014, 10:12 AM

## 2014-03-14 NOTE — Plan of Care (Signed)
Problem: Diagnosis: Increased Risk For Suicide Attempt Goal: STG-Patient Will Attend All Groups On The Unit Outcome: Progressing Patient attended group this evening Goal: STG-Patient Will Comply With Medication Regime Outcome: Progressing Patient compliant with medications

## 2014-03-14 NOTE — [facility] Group Notes (Signed)
[facility] Group Notes:  Healthy support systmes  Date:  03/14/2014  Time:  10:00 AM  Type of Therapy:  Nurse Education  Participation Level:  Active  Participation Quality:  Appropriate  Affect:  Appropriate  Cognitive:  Alert  Insight:  Appropriate  Engagement in Group:  Engaged  Modes of Intervention:  Discussion  Summary of Progress/Problems:Pt stated she is in a better frame of mind today. Her support system is her sister.  Marcello Moores Guyes 03/14/2014, 10:00 AM

## 2014-03-14 NOTE — Progress Notes (Signed)
Writer observed patient sitting in the dayroom watching tv with no interaction with peers. Writer spoke with her 1:1 and she reports she feels much better and the increase in her neurontin dosage has helped with her pain. She reports that she felt that she needed a break from everything going on in her life right now which she thinks has improved how she feels now. She denies si/hi/a/v hallucinations. Support and encouragement given, safety maintained on  Unit wth 15 min checks.

## 2014-03-14 NOTE — Progress Notes (Signed)
[facility] Group Notes:  (Nursing/MHT/Case Management/Adjunct)  Date:  03/14/2014  Time:  10:24 PM  Type of Therapy:  Psychoeducational Skills  Participation Level:  Active  Participation Quality:  Appropriate  Affect:  Flat  Cognitive:  Appropriate  Insight:  Appropriate  Engagement in Group:  Engaged  Modes of Intervention:  Education  Summary of Progress/Problems: The patient described her day as having been "so so". The patient does not have any discharge plans in place at this time. No further details have been provided.   Archie Balboa S 03/14/2014, 10:24 PM

## 2014-03-15 DIAGNOSIS — F411 Generalized anxiety disorder: Secondary | ICD-10-CM

## 2014-03-15 DIAGNOSIS — Z720 Tobacco use: Secondary | ICD-10-CM

## 2014-03-15 LAB — GLUCOSE, CAPILLARY
Glucose-Capillary: 152 mg/dL — ABNORMAL HIGH (ref 70–99)
Glucose-Capillary: 208 mg/dL — ABNORMAL HIGH (ref 70–99)
Glucose-Capillary: 192 mg/dL — ABNORMAL HIGH (ref 70–99)
Glucose-Capillary: 183 mg/dL — ABNORMAL HIGH (ref 70–99)

## 2014-03-15 MED ORDER — GLUCERNA SHAKE PO LIQD
237.0000 mL | Freq: Three times a day (TID) | ORAL | Status: DC
  Administered 2014-03-15: 237 mL via ORAL

## 2014-03-15 MED ORDER — TRAZODONE HCL 50 MG PO TABS
50.0000 mg | ORAL_TABLET | Freq: Every evening | ORAL | Status: DC | PRN
  Administered 2014-03-15: 50 mg via ORAL
  Filled 2014-03-15: qty 14
  Filled 2014-03-15: qty 1

## 2014-03-15 MED ORDER — INSULIN ASPART 100 UNIT/ML ~~LOC~~ SOLN: 0.0000 [IU] | Freq: Every day | SUBCUTANEOUS | Status: DC

## 2014-03-15 MED ORDER — POTASSIUM CHLORIDE CRYS ER 20 MEQ PO TBCR
20.0000 meq | EXTENDED_RELEASE_TABLET | Freq: Every day | ORAL | Status: DC
  Administered 2014-03-15 – 2014-03-16 (×2): 20 meq via ORAL
  Filled 2014-03-15 (×5): qty 1

## 2014-03-15 MED ORDER — INSULIN ASPART 100 UNIT/ML ~~LOC~~ SOLN
0.0000 [IU] | Freq: Three times a day (TID) | SUBCUTANEOUS | Status: DC
  Administered 2014-03-15 – 2014-03-16 (×3): 3 [IU] via SUBCUTANEOUS

## 2014-03-15 MED ORDER — SERTRALINE HCL 25 MG PO TABS
25.0000 mg | ORAL_TABLET | Freq: Every day | ORAL | Status: DC
  Administered 2014-03-15 – 2014-03-16 (×2): 25 mg via ORAL
  Filled 2014-03-15: qty 14
  Filled 2014-03-15 (×5): qty 1
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
## 2014-03-15 NOTE — [facility] Group Notes (Signed)
[facility] LCSW Group Therapy  03/15/2014 1:15 pm  Type of Therapy: Process Group Therapy  Participation Level:  Active  Participation Quality:  Appropriate  Affect:  Flat  Cognitive:  Oriented  Insight:  Improving  Engagement in Group:  Limited  Engagement in Therapy:  Limited  Modes of Intervention:  Activity, Clarification, Education, Problem-solving and Support  Summary of Progress/Problems: Today's group addressed the issue of overcoming obstacles.  Patients were asked to identify their biggest obstacle post d/c that stands in the way of their on-going success, and then problem solve as to how to manage this.  "I was feeling like my back was against the wall.  I was worried about finances and it got me down.  But there was really nothing I could do about it.  I just need to live for one day at a time and not worry so much about the future."  Also talked about how she is finding it helpful to be on the nicotene patch, and her hope that she can quit smoking, "which will give me $5 more a day to use on bills."  Roque Lias B 03/15/2014   3:32 PM

## 2014-03-15 NOTE — Progress Notes (Signed)
Patient ID: Natalie Vaughan, female   DOB: 1960/08/24, 53 y.o.   MRN: [id number]  D: Pt. Denies SI/HI and A/V Hallucinations to this Probation officer. Patient does report chronic neuropathic pain in her legs and received scheduled neurontin for this. Patient rates depression, anxiety, and hopelessness at 0/10 for the day. Patient reports she slept good, her appetite is good, energy level is normal, and concentration level is good today.   A: Support and encouragement provided to the patient to come to Probation officer with questions or concerns. Scheduled medications administered to patient per physician's orders.  R: Patient is receptive and cooperative. Patient is seen in the milieu and is participating in groups. Patient reports that she is ready to go home soon. Q15 minute checks are maintained for safety.

## 2014-03-15 NOTE — Progress Notes (Signed)
Inpatient Diabetes Program Recommendations  AACE/ADA: New Consensus Statement on Inpatient Glycemic Control (2013)  Target Ranges:  Prepandial:   less than 140 mg/dL      Peak postprandial:   less than 180 mg/dL (1-2 hours)      Critically ill patients:  140 - 180 mg/dL   Reason for Visit: Diabetes Consult  Diabetes history: DM2 Outpatient Diabetes medications: Lantus 20 units QHS, metformin 1000 mg bid and glipizide 10 mg QAM. Current orders for Inpatient glycemic control: Same as above  Results for YARAH, FUENTE (MRN [id number]) as of 03/15/2014 12:33  Ref. Range 03/14/2014 12:00 03/14/2014 16:51 03/14/2014 20:54 03/15/2014 06:19 03/15/2014 11:28  Glucose-Capillary Latest Range: 70-99 mg/dL 161 (H) 172 (H) 170 (H) 152 (H) 208 (H)  Results for KYNZEE, DEVINNEY (MRN [id number]) as of 03/15/2014 12:33  Ref. Range 03/12/2014 21:32  Sodium Latest Range: 137-147 mEq/L 136 (L)  Potassium Latest Range: 3.7-5.3 mEq/L 3.4 (L)  Chloride Latest Range: 96-112 mEq/L 95 (L)  CO2 Latest Range: 19-32 mEq/L 20  BUN Latest Range: 6-23 mg/dL 13  Creatinine Latest Range: 0.50-1.10 mg/dL 0.95  Calcium Latest Range: 8.4-10.5 mg/dL 9.7  GFR calc non Af Amer Latest Range: >90 mL/min 66 (L)  GFR calc Af Amer Latest Range: >90 mL/min 77 (L)  Glucose Latest Range: 70-99 mg/dL 71  Anion gap Latest Range: 5-15  21 (H)    Inpatient Diabetes Program Recommendations Insulin - Basal: On home dose of Lantus 20 units QHS Correction (SSI): Consider addition of Novolog moderate tidwc and hs Oral Agents: On home dose of metformin 1000 mg bid HgbA1C: Check HgbA1C to assess glycemic control prior to hospitalization  Note: Encourage pt to make healthy choices using portion control, in the cafeteria.  Will continue to follow. Thank you. Lorenda Peck, RD, LDN, CDE Inpatient Diabetes Coordinator [phone]

## 2014-03-15 NOTE — Progress Notes (Signed)
The focus of this group is to help patients review their daily goal of treatment and discuss progress on daily workbooks. Pt attended the evening group session and responded to all discussion prompts from the [place] [employer]. Pt shared that today was a good day on the unit, the highlight of which was talking to her sister and husband on the phone. Pt told the group that upon discharge she planned to stay well by not taking on the problems of other people. "I need to be careful who I surround myself with so I don't get dragged down." Pt's affect was appropriate.

## 2014-03-15 NOTE — [facility] Suicide Risk Assessment (Signed)
[facility] INPATIENT:  Family/Significant Other Suicide Prevention Education  Suicide Prevention Education:  Education Completed; Amrie Gurganus (pt's husband) [phone] has been identified by the patient as the family member/significant other with whom the patient will be residing, and identified as the person(s) who will aid the patient in the event of a mental health crisis (suicidal ideations/suicide attempt).  With written consent from the patient, the family member/significant other has been provided the following suicide prevention education, prior to the and/or following the discharge of the patient.  The suicide prevention education provided includes the following:  Suicide risk factors  Suicide prevention and interventions  National Suicide Hotline telephone number  [facility] assessment telephone number  [facility] Emergency Assistance [place] and/or Residential Mobile Crisis Unit telephone number  Request made of family/significant other to:  Remove weapons (e.g., guns, rifles, knives), all items previously/currently identified as safety concern.    Remove drugs/medications (over-the-counter, prescriptions, illicit drugs), all items previously/currently identified as a safety concern.  The family member/significant other verbalizes understanding of the suicide prevention education information provided.  The family member/significant other agrees to remove the items of safety concern listed above.  Smart, Ky Rumple LCSWA  03/15/2014, 2:18 PM

## 2014-03-15 NOTE — [facility] Group Notes (Signed)
[facility] LCSW Aftercare Discharge Planning Group Note   03/15/2014 10:52 AM  Participation Quality:  Appropriate   Mood/Affect:  Appropriate  Depression Rating:  1  Anxiety Rating:  1  Thoughts of Suicide:  No Will you contract for safety?   NA  Current AVH:  No  Plan for Discharge/Comments:  Pt reports that she brought herself to the hospital in order to get "my head straight." PT reports some stressors surrounding medical issues that caused her to stop working. Pt stated that she no longer feels depressed/no SI. Pt minimizes alcohol consumption and reports that she does not SA IOP. Pt plans to return home at d/c and will follow up with [facility] for med management (Dr. Junius Roads). CSW providing pt with comprehensive AA list for [facility].   Transportation Means: [facility]y P[facility] will need to be contacted by CSW to transport her [facility] at d/c. From there, her sister will pick her up and bring her home.   Supports: Primary school teacher, Research officer, trade union

## 2014-03-16 LAB — COMPREHENSIVE METABOLIC PANEL
ALT: 31 U/L (ref 0–35)
AST: 30 U/L (ref 0–37)
Albumin: 3.8 g/dL (ref 3.5–5.2)
Alkaline Phosphatase: 82 U/L (ref 39–117)
Anion gap: 14 (ref 5–15)
BUN: 15 mg/dL (ref 6–23)
CO2: 25 mEq/L (ref 19–32)
Calcium: 10 mg/dL (ref 8.4–10.5)
Chloride: 102 mEq/L (ref 96–112)
Creatinine, Ser: 0.87 mg/dL (ref 0.50–1.10)
GFR calc Af Amer: 85 mL/min — ABNORMAL LOW (ref >90)
GFR calc non Af Amer: 74 mL/min — ABNORMAL LOW (ref >90)
Glucose, Bld: 162 mg/dL — ABNORMAL HIGH (ref 70–99)
Potassium: 4 mEq/L (ref 3.7–5.3)
Sodium: 141 mEq/L (ref 137–147)
Total Bilirubin: 0.3 mg/dL (ref 0.3–1.2)
Total Protein: 7.5 g/dL (ref 6.0–8.3)

## 2014-03-16 LAB — HEMOGLOBIN A1C
Hgb A1c MFr Bld: 6.1 % — ABNORMAL HIGH (ref <5.7)
Mean Plasma Glucose: 128 mg/dL — ABNORMAL HIGH (ref <117)

## 2014-03-16 LAB — GLUCOSE, CAPILLARY
Glucose-Capillary: 165 mg/dL — ABNORMAL HIGH (ref 70–99)
Glucose-Capillary: 156 mg/dL — ABNORMAL HIGH (ref 70–99)

## 2014-03-16 LAB — TSH: TSH: 3.13 u[IU]/mL (ref 0.350–4.500)

## 2014-03-16 MED ORDER — TRAZODONE HCL 50 MG PO TABS: 50.0000 mg | ORAL_TABLET | Freq: Every evening | ORAL | Status: DC | PRN

## 2014-03-16 MED ORDER — SERTRALINE HCL 25 MG PO TABS: 25.0000 mg | ORAL_TABLET | Freq: Every day | ORAL | Status: DC

## 2014-03-16 MED ORDER — HYDROCHLOROTHIAZIDE 25 MG PO TABS: 25.0000 mg | ORAL_TABLET | Freq: Every day | ORAL | Status: DC

## 2014-03-16 MED ORDER — ASPIRIN EC 325 MG PO TBEC: 325.0000 mg | DELAYED_RELEASE_TABLET | Freq: Every day | ORAL | Status: AC

## 2014-03-16 MED ORDER — ROSUVASTATIN CALCIUM 20 MG PO TABS: 20.0000 mg | ORAL_TABLET | Freq: Every day | ORAL | Status: DC

## 2014-03-16 MED ORDER — GLIPIZIDE ER 10 MG PO TB24: 10.0000 mg | ORAL_TABLET | Freq: Every day | ORAL | Status: DC

## 2014-03-16 MED ORDER — ADULT MULTIVITAMIN W/MINERALS CH: 1.0000 | ORAL_TABLET | Freq: Every day | ORAL | Status: DC

## 2014-03-16 MED ORDER — METOPROLOL TARTRATE 25 MG PO TABS: 25.0000 mg | ORAL_TABLET | Freq: Two times a day (BID) | ORAL | Status: DC

## 2014-03-16 MED ORDER — GABAPENTIN 400 MG PO CAPS: 1200.0000 mg | ORAL_CAPSULE | Freq: Two times a day (BID) | ORAL | Status: DC

## 2014-03-16 MED ORDER — INSULIN GLARGINE 100 UNIT/ML SOLOSTAR PEN: 20.0000 [IU] | PEN_INJECTOR | Freq: Every day | SUBCUTANEOUS | Status: AC

## 2014-03-16 MED ORDER — GABAPENTIN 600 MG PO TABS
1200.0000 mg | ORAL_TABLET | Freq: Two times a day (BID) | ORAL | Status: DC
  Filled 2014-03-16: qty 56

## 2014-03-16 MED ORDER — METFORMIN HCL 1000 MG PO TABS: 1000.0000 mg | ORAL_TABLET | Freq: Two times a day (BID) | ORAL | Status: DC

## 2014-03-16 NOTE — Progress Notes (Signed)
D: Pt presents flat in affect and depressed in mood. Pt was guarded in interaction with this Probation officer. Pt is currently denying any SI/HI. Pt also denied any AVH. "I'm done with all of that" (in reference to [place]). Pt actively participated in group this evening. Pt had no concerns she wished for this writer to address at this time.  A: Writer administered scheduled medications to pt, per MD orders. Writer also verbalized the indications and onset times of her Novolog and Lantus. Continued support and availability as needed was extended to this pt. Staff continue to monitor pt with q26min checks.  R: No adverse drug reactions noted. Pt receptive to treatment. Pt remains safe at this time.

## 2014-03-16 NOTE — [facility] Group Notes (Signed)
[facility] Group Notes:  (Nursing/MHT/Case Management/Adjunct)  Date:  03/16/2014  Time:  9:39 AM  Type of Therapy:  Nurse Education    Participation Level:  Minimal  Participation Quality:  Present but did not participate.  Affect:  Blunted  Cognitive:  Alert and Oriented  Insight:  None  Engagement in Group:  Poor  Modes of Intervention:  Discussion, Orientation and Support  Summary of Progress/Problems:  Charlyne Quale A 03/16/2014, 9:39 AM

## 2014-03-16 NOTE — [facility] Group Notes (Signed)
[facility] LCSW Group Therapy  03/16/2014 , 1:44 PM   Type of Therapy:  Group Therapy  Participation Level:  Active  Participation Quality:  Attentive  Affect:  Appropriate  Cognitive:  Alert  Insight:  Improving  Engagement in Therapy:  Engaged  Modes of Intervention:  Discussion, Exploration and Socialization  Summary of Progress/Problems: Today's group focused on the term Diagnosis.  Participants were asked to define the term, and then pronounce whether it is a negative, positive or neutral term.  Natalie Vaughan was more focused on symptoms than diagnosis.  She identified her isolation previous to admission, and how that was detrimental to her well being.  "But I got so tired of being asked for help by people, I just needed to shut them out."  She identifies her sister as a positive support who does not ask of her, and is planning on spending more time with her.  She was challenged by the group to find more creative outlets for her talents.  They experience her as a giving, people person who could volunteer at places where others need encouragement.  Roque Lias B 03/16/2014 , 1:44 PM

## 2014-03-16 NOTE — Progress Notes (Signed)
DISCHARGE NOTE D: Patient alert, oriented, and in stable condition upon discharge. Pt denies SI/HI and AVH. A: AVS reviewed with and given to pt. Medications and prescriptions given to pt. Follow up reviewed with pt. Pt was given time to express concerns and questions. Belongings returned to pt. Community resources discussed with pt. R: Pt D/C'd with a steady gait to a Public affairs consultant.

## 2014-03-16 NOTE — [facility] Suicide Risk Assessment (Signed)
   Demographic Factors:  Low socioeconomic status and Unemployed  Total Time spent with patient: 45 minutes  Psychiatric Specialty Exam: Physical Exam  Constitutional: She is oriented to person, place, and time. She appears well-developed and well-nourished.  HENT:  Head: Normocephalic and atraumatic.  Eyes: EOM are normal.  Neck: Normal range of motion. Neck supple.  Respiratory: Effort normal.  GI: Soft.  Musculoskeletal: Normal range of motion.  Neurological: She is alert and oriented to person, place, and time.  Skin: Skin is warm.  Psychiatric: She has a normal mood and affect. Her behavior is normal. Judgment and thought content normal.    Review of Systems  Constitutional: Negative.   HENT: Negative.   Eyes: Negative.   Respiratory: Negative.   Cardiovascular: Negative.   Gastrointestinal: Negative.   Genitourinary: Negative.   Musculoskeletal: Negative.   Skin: Negative.   Neurological: Negative.   Psychiatric/Behavioral: Positive for substance abuse. Negative for depression, suicidal ideas and hallucinations. The patient is not nervous/anxious and does not have insomnia.     Blood pressure 125/70, pulse 99, temperature 98.7 F (37.1 C), temperature source Oral, resp. rate 20, height 5\' 6"  (1.676 m), weight 97.523 kg (215 lb).Body mass index is 34.72 kg/(m^2).  General Appearance: Casual  Eye Contact::  Fair  Speech:  Clear and Coherent  Volume:  Normal  Mood:  Euphoric  Affect:  Congruent  Thought Process:  Coherent  Orientation:  Full (Time, Place, and Person)  Thought Content:  WDL  Suicidal Thoughts:  No  Homicidal Thoughts:  No  Memory:  Immediate;   Fair Recent;   Fair Remote;   Fair  Judgement:  Fair  Insight:  Fair  Psychomotor Activity:  Normal  Concentration:  Fair  Recall:  [employer] of Knowledge:Fair  Language: Good  Akathisia:  No  Handed:  Right  AIMS (if indicated):     Assets:  Communication Skills Desire for  Improvement Housing Intimacy Physical Health Social Support  Sleep:  Number of Hours: 6.75    Musculoskeletal: Strength & Muscle Tone: within normal limits Gait & Station: normal Patient leans: N/A   Mental Status Per Nursing Assessment::   On Admission:  Suicidal ideation indicated by patient  Current Mental Status by Physician: DENIES SI/HI/AH/VH  Loss Factors: NA  Historical Factors: Impulsivity  Risk Reduction Factors:   Religious beliefs about death, Living with another person, especially a relative and Positive social support  Continued Clinical Symptoms:  Alcohol/Substance Abuse/Dependencies  Cognitive Features That Contribute To Risk:  Polarized thinking    Suicide Risk:  Minimal: No identifiable suicidal ideation.    Discharge Diagnoses:  DSM5:  Primary Psychiatric Diagnosis:  Generalized Anxiety disorder (Improving)   Secondary Psychiatric Diagnosis:  Major Depressive disorder,recurrent ,mild Alcohol use disorder,mild  Tobacco use disorder     Non Psychiatric Diagnosis:  Diabetes Mellitus  Chronic back pain  Hypertension  Neuropathy   Past Medical History  Diagnosis Date  . Diabetes mellitus without complication   . Neuropathy   . Depression   . Anxiety     Plan Of Care/Follow-up recommendations:  Activity:   Diet:   Tests:  See DC instructions  Is patient on multiple antipsychotic therapies at discharge:  No   Has Patient had three or more failed trials of antipsychotic monotherapy by history:  No  Recommended Plan for Multiple Antipsychotic Therapies: NA    Saniyyah Elster MD 03/16/2014, 8:39 AM

## 2014-03-16 NOTE — Progress Notes (Signed)
D: Patient alert and oriented. Pt's mood is pleasant and affect is appropriate. Pt denies SI/HI and AVH at this time. Pt states she is ready to go home. Pt is attending groups. Pt reports her goal for the day is to "keep happy thoughts, one day at a time." Pt reports hopelessness, depression, and anxiety 0/10. A: CBG monitored per providers orders (See Doc Flowsheets). Medications administered per providers orders (See MAR). 15 minute checks completed per protocol for patient safety.  R: Pt cooperative and receptive to nursing interventions.
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
## 2014-03-16 NOTE — Progress Notes (Signed)
Adult Psychoeducational Group Note  Date:  03/16/2014 Time:  10:49 AM  Group Topic/Focus:  Therapeutic Activity  Participation Level:  Active  Participation Quality:  Appropriate  Affect:  Appropriate  Cognitive:  Appropriate  Insight: Good  Engagement in Group:  Engaged  Modes of Intervention:  Activity, Discussion and Socialization  Additional Comments: Today's therapeutic activity was Landscape architect. This was a fun way to identify coping skills to use in stressful situations.  Sherlean Foot N 03/16/2014, 10:49 AM

## 2014-03-16 NOTE — Progress Notes (Signed)
NUTRITION ASSESSMENT  RD consulted for diet education  INTERVENTION: 1. Educated patient on the importance of nutrition and encouraged intake of food and beverages. 2. Discussed weight goals. 3. Supplements: Glucerna Shake po TID, each supplement provides 220 kcal and 10 grams of protein  NUTRITION DIAGNOSIS: Food and nutrition related knowledge deficit related to lack of prior diet education as evidenced by pt report.  Goal: Pt to meet >/= 90% of their estimated nutrition needs.  Monitor:  PO intake  Assessment:  53 y.o. Female who presents with symptoms of depression including suicidal ideation.She states she drinks beer and wine daily "to calm my nerves" and Pt's blood alcohol level upon arrival is 197. She reports drinking approximately two quarts of beer daily and denies having withdrawal symptoms when she stops.    Pt saw diabetes coordinator 10/26. When asked about this, pt did not recall this visit. Pt did not recall having diabetes. Pt states  "I don't eat the way I should. I don't eat the diet foods".  Provided pt with "Plate Method" visual handout. Discussed with pt the importance of eating 3 meals a day with snacks, emphasizing protein consumption. Discussed the importance of good nutrition for mental health and aiding in recovery.   Pt is receiving Glucerna Shakes TID but is currently refusing them.  Height: Ht Readings from Last 1 Encounters:  03/13/14 5\' 6"  (1.676 m)    Weight: Wt Readings from Last 1 Encounters:  03/13/14 215 lb (97.523 kg)    Weight Hx: Wt Readings from Last 10 Encounters:  03/13/14 215 lb (97.523 kg)  03/12/14 214 lb (97.07 kg)    BMI:  Body mass index is 34.72 kg/(m^2). Pt meets criteria for obesity based on current BMI.  Estimated Nutritional Needs: Kcal: 25-30 kcal/kg Protein: > 1 gram protein/kg Fluid: 1 ml/kcal  Diet Order: Carb Control Pt is also offered choice of unit snacks mid-morning and mid-afternoon.  Pt is eating as  desired.   Lab results and medications reviewed: Zoloft Glucose 162  [place] [address], [place], [place], [place] Pager: [phone] After Hours Pager: [phone]

## 2014-03-16 NOTE — Progress Notes (Signed)
[facility] Adult Case Management Discharge Plan :  Will you be returning to the same living situation after discharge: Yes,  home with husband At discharge, do you have transportation home?:Yes,  [place] coming at 3:30PM. Pt's sister will pick her up from [facility].  Do you have the ability to pay for your medications:Yes,  Mental health  Release of information consent forms completed and submitted to Medical Records by CSW.  Patient to Follow up at: Follow-up Information   Follow up with Tamela Gammon On 03/19/2014. (Follow-up appt scheduled for this date. Walk in between 7:45AM-11AM to be seen. Referral number: [id number])    Contact information:   [place] [place], [place] [address] Phone: [phone] Fax: [phone]      Patient denies SI/HI:   Yes,  during group/self report.     Safety Planning and Suicide Prevention discussed:  Yes,  SPE completed with pt's husband. SPI pamphlet provided to pt and she was encouraged to share information with support network, ask questions, and talk about any concerns relating to SPE.  Smart, Alauna Hayden LCSWA  03/16/2014, 9:28 AM

## 2014-03-19 NOTE — Progress Notes (Signed)
Patient Discharge Instructions:  After Visit Summary (AVS):   Faxed to:  03/19/14 Discharge Summary Note:   Faxed to:  03/19/14 Psychiatric Admission Assessment Note:   Faxed to:  03/19/14 Suicide Risk Assessment - Discharge Assessment:   Faxed to:  03/19/14 Faxed/Sent to the Next Level Care provider:  03/19/14 Faxed to [facility] [place], 03/19/2014, 3:32 PM

## 2015-04-11 ENCOUNTER — Other Ambulatory Visit: Payer: Self-pay | Admitting: Physician Assistant

## 2015-04-12 ENCOUNTER — Other Ambulatory Visit: Payer: Self-pay | Admitting: Physician Assistant

## 2015-04-12 MED ORDER — ATORVASTATIN CALCIUM 20 MG PO TABS: 20.0000 mg | ORAL_TABLET | Freq: Every day | ORAL | Status: DC

## 2015-04-22 ENCOUNTER — Other Ambulatory Visit: Payer: Self-pay | Admitting: Physician Assistant

## 2015-04-22 LAB — LIPID PANEL
Cholesterol: 151 mg/dL (ref 125–200)
HDL: 51 mg/dL (ref >=46)
LDL Cholesterol: 81 mg/dL (ref <130)
Total CHOL/HDL Ratio: 3 Ratio (ref <=5.0)
Triglycerides: 93 mg/dL (ref <150)
VLDL: 19 mg/dL (ref <30)

## 2015-04-22 LAB — COMPREHENSIVE METABOLIC PANEL
ALT: 20 U/L (ref 6–29)
AST: 22 U/L (ref 10–35)
Albumin: 4.3 g/dL (ref 3.6–5.1)
Alkaline Phosphatase: 66 U/L (ref 33–130)
BUN: 19 mg/dL (ref 7–25)
CO2: 25 mmol/L (ref 20–31)
Calcium: 9.6 mg/dL (ref 8.6–10.4)
Chloride: 108 mmol/L (ref 98–110)
Creat: 0.94 mg/dL (ref 0.50–1.05)
Glucose, Bld: 77 mg/dL (ref 65–99)
Potassium: 4.2 mmol/L (ref 3.5–5.3)
Sodium: 139 mmol/L (ref 135–146)
Total Bilirubin: 0.7 mg/dL (ref 0.2–1.2)
Total Protein: 7.2 g/dL (ref 6.1–8.1)

## 2015-04-22 LAB — HEMOGLOBIN A1C
Hgb A1c MFr Bld: 7.1 % — ABNORMAL HIGH (ref <5.7)
Mean Plasma Glucose: 157 mg/dL — ABNORMAL HIGH (ref <117)

## 2015-04-27 ENCOUNTER — Ambulatory Visit: Payer: Self-pay | Admitting: Physician Assistant

## 2015-04-27 ENCOUNTER — Encounter: Payer: Self-pay | Admitting: Physician Assistant

## 2015-04-27 VITALS — BP 122/70 | HR 91 | Temp 98.1°F | Ht 64.5 in | Wt 207.6 lb

## 2015-04-27 DIAGNOSIS — I10 Essential (primary) hypertension: Secondary | ICD-10-CM

## 2015-04-27 DIAGNOSIS — E1142 Type 2 diabetes mellitus with diabetic polyneuropathy: Secondary | ICD-10-CM

## 2015-04-27 DIAGNOSIS — E785 Hyperlipidemia, unspecified: Secondary | ICD-10-CM

## 2015-04-27 DIAGNOSIS — E118 Type 2 diabetes mellitus with unspecified complications: Secondary | ICD-10-CM

## 2015-04-27 DIAGNOSIS — E669 Obesity, unspecified: Secondary | ICD-10-CM

## 2015-04-27 NOTE — Progress Notes (Signed)
BP 122/70 mmHg  Pulse 91  Temp(Src) 98.1 F (36.7 C)  Ht 5' 4.5" (1.638 m)  Wt 207 lb 9.6 oz (94.167 kg)  BMI 35.10 kg/m2  SpO2 93%   Subjective:    Patient ID: Natalie Vaughan, female    DOB: 09-14-1960, 54 y.o.   MRN: [id number]  HPI: Natalie Vaughan is a 54 y.o. female presenting on 04/27/2015 for Diabetes   HPI Chief Complaint  Patient presents with  . Diabetes    pt states she feels okay, but just has a cold    Relevant past medical, surgical, family and social history reviewed and updated as indicated. Interim medical history since our last visit reviewed. Allergies and medications reviewed and updated.  Current outpatient prescriptions:  .  aspirin EC 325 MG tablet, Take 1 tablet (325 mg total) by mouth daily. (Patient taking differently: Take 81 mg by mouth daily. ), Disp: 30 tablet, Rfl: 0 .  atorvastatin (LIPITOR) 20 MG tablet, Take 1 tablet (20 mg total) by mouth daily., Disp: 90 tablet, Rfl: 2 .  citalopram (CELEXA) 20 MG tablet, Take 20 mg by mouth daily., Disp: , Rfl:  .  gabapentin (NEURONTIN) 300 MG capsule, TAKE 4 Capsules  BY MOUTH TWICE DAILY, Disp: 720 capsule, Rfl: 1 .  glipiZIDE (GLUCOTROL XL) 10 MG 24 hr tablet, Take 1 tablet (10 mg total) by mouth daily with breakfast., Disp: , Rfl:  .  hydrochlorothiazide (HYDRODIURIL) 25 MG tablet, Take 1 tablet (25 mg total) by mouth daily., Disp: , Rfl:  .  Insulin Glargine (LANTUS SOLOSTAR) 100 UNIT/ML Solostar Pen, Inject 20 Units into the skin daily at 10 pm., Disp: 15 mL, Rfl: 11 .  metFORMIN (GLUCOPHAGE) 1000 MG tablet, Take 1 tablet (1,000 mg total) by mouth 2 (two) times daily., Disp: , Rfl:  .  metoprolol tartrate (LOPRESSOR) 25 MG tablet, Take 1 tablet (25 mg total) by mouth 2 (two) times daily., Disp: , Rfl:    Review of Systems  Constitutional: Positive for diaphoresis, fatigue and unexpected weight change. Negative for fever, chills and appetite change.  HENT: Positive for congestion and hearing loss. Negative  for dental problem, drooling, ear pain, facial swelling, mouth sores, sneezing, sore throat, trouble swallowing and voice change.   Eyes: Negative for pain, discharge, redness, itching and visual disturbance.  Respiratory: Positive for shortness of breath and wheezing. Negative for cough and choking.   Cardiovascular: Negative for chest pain, palpitations and leg swelling.  Gastrointestinal: Positive for constipation. Negative for vomiting, abdominal pain, diarrhea and blood in stool.  Endocrine: Negative for cold intolerance, heat intolerance and polydipsia.  Genitourinary: Negative for dysuria, hematuria and decreased urine volume.  Musculoskeletal: Positive for back pain, arthralgias and gait problem.  Skin: Negative for rash.  Allergic/Immunologic: Negative for environmental allergies.  Neurological: Negative for seizures, syncope, light-headedness and headaches.  Hematological: Negative for adenopathy.  Psychiatric/Behavioral: Positive for dysphoric mood. Negative for suicidal ideas and agitation. The patient is nervous/anxious.     Per HPI unless specifically indicated above     Objective:    BP 122/70 mmHg  Pulse 91  Temp(Src) 98.1 F (36.7 C)  Ht 5' 4.5" (1.638 m)  Wt 207 lb 9.6 oz (94.167 kg)  BMI 35.10 kg/m2  SpO2 93%  Wt Readings from Last 3 Encounters:  04/27/15 207 lb 9.6 oz (94.167 kg)  03/13/14 215 lb (97.523 kg)  03/12/14 214 lb (97.07 kg)    Physical Exam  Constitutional: She is oriented to person, place,  and time. She appears well-developed and well-nourished.  HENT:  Head: Normocephalic and atraumatic.  Neck: Neck supple.  Cardiovascular: Normal rate and regular rhythm.   Pulmonary/Chest: Effort normal and breath sounds normal.  Abdominal: Soft. Bowel sounds are normal. She exhibits no mass. There is no tenderness.  Musculoskeletal: She exhibits no edema.  Lymphadenopathy:    She has no cervical adenopathy.  Neurological: She is alert and oriented to  person, place, and time.  Skin: Skin is warm and dry.  Psychiatric: She has a normal mood and affect. Her behavior is normal.  Vitals reviewed.   Results for orders placed or performed in visit on 04/22/15  Comprehensive metabolic panel  Result Value Ref Range   Sodium 139 135 - 146 mmol/L   Potassium 4.2 3.5 - 5.3 mmol/L   Chloride 108 98 - 110 mmol/L   CO2 25 20 - 31 mmol/L   Glucose, Bld 77 65 - 99 mg/dL   BUN 19 7 - 25 mg/dL   Creat 0.94 0.50 - 1.05 mg/dL   Total Bilirubin 0.7 0.2 - 1.2 mg/dL   Alkaline Phosphatase 66 33 - 130 U/L   AST 22 10 - 35 U/L   ALT 20 6 - 29 U/L   Total Protein 7.2 6.1 - 8.1 g/dL   Albumin 4.3 3.6 - 5.1 g/dL   Calcium 9.6 8.6 - 10.4 mg/dL  Lipid panel  Result Value Ref Range   Cholesterol 151 125 - 200 mg/dL   Triglycerides 93 <150 mg/dL   HDL 51 >=46 mg/dL   Total CHOL/HDL Ratio 3.0 <=5.0 Ratio   VLDL 19 <30 mg/dL   LDL Cholesterol 81 <130 mg/dL  Hemoglobin A1c  Result Value Ref Range   Hgb A1c MFr Bld 7.1 (H) <5.7 %   Mean Plasma Glucose 157 (H) <117 mg/dL      Assessment & Plan:   Encounter Diagnoses  Name Primary?  . Type 2 diabetes mellitus with complication, unspecified long term insulin use status ([place]) Yes  . Essential hypertension, benign   . Hyperlipidemia   . Obesity, unspecified   . Diabetic polyneuropathy associated with type 2 diabetes mellitus ([place])     -reviewed labs with pt- much improved from 3 mo ago -Pt has appt for DM eye exam in February -Cont with [facility] for [facility] care -F/u OV 3 mo. rto sooner prn

## 2015-05-04 DIAGNOSIS — E785 Hyperlipidemia, unspecified: Secondary | ICD-10-CM | POA: Insufficient documentation

## 2015-05-04 DIAGNOSIS — E669 Obesity, unspecified: Secondary | ICD-10-CM | POA: Insufficient documentation

## 2015-05-04 DIAGNOSIS — E118 Type 2 diabetes mellitus with unspecified complications: Secondary | ICD-10-CM | POA: Insufficient documentation

## 2015-05-04 DIAGNOSIS — I10 Essential (primary) hypertension: Secondary | ICD-10-CM | POA: Insufficient documentation

## 2015-05-04 DIAGNOSIS — E1142 Type 2 diabetes mellitus with diabetic polyneuropathy: Secondary | ICD-10-CM | POA: Insufficient documentation

## 2015-07-06 ENCOUNTER — Other Ambulatory Visit: Payer: Self-pay | Admitting: Physician Assistant

## 2015-07-06 ENCOUNTER — Encounter: Payer: Self-pay | Admitting: Physician Assistant

## 2015-07-06 ENCOUNTER — Ambulatory Visit: Payer: Self-pay | Admitting: Physician Assistant

## 2015-07-06 VITALS — BP 128/80 | HR 92 | Temp 97.7°F | Ht 64.5 in | Wt 213.0 lb

## 2015-07-06 DIAGNOSIS — M25552 Pain in left hip: Secondary | ICD-10-CM

## 2015-07-06 MED ORDER — GLIPIZIDE 10 MG PO TABS: 10.0000 mg | ORAL_TABLET | Freq: Two times a day (BID) | ORAL | Status: DC

## 2015-07-06 NOTE — Progress Notes (Signed)
BP 128/80 mmHg  Pulse 92  Temp(Src) 97.7 F (36.5 C)  Ht 5' 4.5" (1.638 m)  Wt 213 lb (96.616 kg)  BMI 36.01 kg/m2  SpO2 95%   Subjective:    Patient ID: Natalie Vaughan, female    DOB: [birth year], 55 y.o.   MRN: [id number]  HPI: Natalie Vaughan is a 55 y.o. female presenting on 07/06/2015 for Follow-up   HPI   Chief Complaint  Patient presents with  . Follow-up    has been to [facility] ER 06-14-15 and 07-04-15 due to unable to put weigtht on left leg, pain was severe    Pt states this is very different than her neuropathy pain.  She denies injury. States that she just woke up morning in January and was hurting really bad.  She tried to tolerate it but couldn't- went to [facility] ER twice.    Pt states that the treatments she got have helped some but she is still hurting.  She got pain shots.  She was started on prednisone 07/04/15 which she thinks has helped the most.  She has only had 2 days of the prednisone (out of a 12 day taper)  She is aware that the prednisone will make her sugars go up and has been monitoring the blood sugars.  Pain is L hip all the way down into her foot.  Doesn't hurt in back, just the L hip/buttock area and down.   She states not really having any numbness but mostly just the pain.  No history Left hip problems.  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  Says she was dx with lumbar strain.  Pt brings papers from [facility]- shows labs- and that an xray was done.  Relevant past medical, surgical, family and social history reviewed and updated as indicated. Interim medical history since our last visit reviewed. Allergies and medications reviewed and updated.  Current outpatient prescriptions:  .  acetaminophen (TYLENOL) 500 MG chewable tablet, Chew 1,000 mg by mouth every 6 (six) hours as needed for pain., Disp: , Rfl:  .  aspirin EC 325 MG tablet, Take 1 tablet (325 mg total) by mouth daily. (Patient  taking differently: Take 81 mg by mouth daily. ), Disp: 30 tablet, Rfl: 0 .  atorvastatin (LIPITOR) 20 MG tablet, Take 1 tablet (20 mg total) by mouth daily., Disp: 90 tablet, Rfl: 2 .  citalopram (CELEXA) 20 MG tablet, Take 20 mg by mouth daily., Disp: , Rfl:  .  doxepin (SINEQUAN) 10 MG capsule, Take 10 mg by mouth at bedtime., Disp: , Rfl:  .  gabapentin (NEURONTIN) 300 MG capsule, TAKE 4 Capsules  BY MOUTH TWICE DAILY, Disp: 720 capsule, Rfl: 1 .  glipiZIDE (GLUCOTROL XL) 10 MG 24 hr tablet, Take 1 tablet (10 mg total) by mouth daily with breakfast., Disp: , Rfl:  .  hydrochlorothiazide (HYDRODIURIL) 25 MG tablet, Take 1 tablet (25 mg total) by mouth daily., Disp: , Rfl:  .  HYDROcodone-acetaminophen (NORCO/VICODIN) 5-325 MG tablet, Take 1 tablet by mouth every 4 (four) hours as needed for moderate pain., Disp: , Rfl:  .  Insulin Glargine (LANTUS SOLOSTAR) 100 UNIT/ML Solostar Pen, Inject 20 Units into the skin daily at 10 pm., Disp: 15 mL, Rfl: 11 .  lisinopril (PRINIVIL,ZESTRIL) 20 MG tablet, Take 20 mg by mouth daily., Disp: , Rfl:  .  metFORMIN (GLUCOPHAGE) 1000 MG tablet, Take 1 tablet (1,000  mg total) by mouth 2 (two) times daily., Disp: , Rfl:  .  metoprolol tartrate (LOPRESSOR) 25 MG tablet, Take 1 tablet (25 mg total) by mouth 2 (two) times daily., Disp: , Rfl:  .  predniSONE (DELTASONE) 20 MG tablet, Take 20 mg by mouth. Take 3 tabs po qd for 3 days, then 2 tabs qd for 3 days, then 1 tab qd for 3 days, then 1/2 tab qd for 3 days, then stop, Disp: , Rfl:   Review of Systems  Constitutional: Positive for fatigue. Negative for fever, chills, diaphoresis, appetite change and unexpected weight change.  HENT: Positive for congestion. Negative for dental problem, drooling, ear pain, facial swelling, hearing loss, mouth sores, sneezing, sore throat, trouble swallowing and voice change.   Eyes: Negative for pain, discharge, redness, itching and visual disturbance.  Respiratory: Positive for  cough, shortness of breath and wheezing. Negative for choking.   Cardiovascular: Negative for chest pain, palpitations and leg swelling.  Gastrointestinal: Positive for constipation. Negative for vomiting, abdominal pain, diarrhea and blood in stool.  Endocrine: Negative for cold intolerance, heat intolerance and polydipsia.  Genitourinary: Negative for dysuria, hematuria and decreased urine volume.  Musculoskeletal: Positive for arthralgias and gait problem. Negative for back pain.  Skin: Negative for rash.  Allergic/Immunologic: Negative for environmental allergies.  Neurological: Negative for seizures, syncope, light-headedness and headaches.  Hematological: Negative for adenopathy.  Psychiatric/Behavioral: Positive for dysphoric mood and agitation. Negative for suicidal ideas. The patient is nervous/anxious.     Per HPI unless specifically indicated above     Objective:    BP 128/80 mmHg  Pulse 92  Temp(Src) 97.7 F (36.5 C)  Ht 5' 4.5" (1.638 m)  Wt 213 lb (96.616 kg)  BMI 36.01 kg/m2  SpO2 95%  Wt Readings from Last 3 Encounters:  07/06/15 213 lb (96.616 kg)  04/27/15 207 lb 9.6 oz (94.167 kg)  03/13/14 215 lb (97.523 kg)    Physical Exam  Constitutional: She is oriented to person, place, and time. She appears well-developed and well-nourished.  Cardiovascular:  Pulses:      Dorsalis pedis pulses are 2+ on the left side.       Posterior tibial pulses are 2+ on the left side.  Pulmonary/Chest: Effort normal.  Musculoskeletal: She exhibits no edema.       Left hip: She exhibits decreased range of motion (very minimally decreased internal rotation). She exhibits no tenderness, no bony tenderness and no swelling.       Left knee: Normal.  Neurological: She is alert and oriented to person, place, and time. No sensory deficit.  Reflex Scores:      Patellar reflexes are 2+ on the right side and 2+ on the left side. Skin: Skin is warm and dry.  Psychiatric: She has a normal  mood and affect. Her behavior is normal.        Assessment & Plan:   Encounter Diagnosis  Name Primary?  . Left hip pain Yes    -Xray reports requested from [facility]  very slow coming today- over 1 1/2 hour.  Pt needed to leave.  Will call pt when they come in and are reviewed.  Gave pt [facility] discount application in case she needs mri or other testing or referral. -Discussed otc apap with her vicodin and safety -continue prednisone taper Routine f/u 3/7- will not check labs prior to that appointment since pt will be just coming off the prednisone and most recent labs were okay ** From [facility]:  -  xray-   Normal L hip. Mild lower lumbar spine degenerative changes.  ([facility]- 06/14/15) -since pt just started 12 day taper of prednisone, will let this do its work and see if it resolves her problems.  Will need to re-evaluated prior to deteming next step.  She should RTO if worsens

## 2015-07-13 ENCOUNTER — Telehealth: Payer: Self-pay | Admitting: Student

## 2015-07-13 NOTE — Telephone Encounter (Signed)
Pt called the clinic and was notified that her xray report showed normal L hip, and since she started 12 day taper of prednisone, we will let it do it's work and see if it resolves her problem. Pt was informed that re-evaluation will be needed if she is not better after finishing prednisone. pt understood.  Pt was also informed that glipizide XL was discontinued by [facility], so glipizide immediate release was sent to them for her to start and will need to take BID. Pt understood.

## 2015-07-13 NOTE — Telephone Encounter (Signed)
-----   Message from Soyla Dryer, [place] sent at 07/06/2015  2:43 PM EST ----- Please call pt and let her know that I got her xray report.  It showed  Normal L hip. Since pt just started 12 day taper of prednisone, will let this do its work and see if it resolves her problems.  Will need to be re-evaluated if she is not better after finishing this medication. -Also let her know that the glipizide XL was discontinued by [facility] so she will be getting immediate release glipizide instead so it needs to be taken bid (as the label will indicate)

## 2015-07-26 ENCOUNTER — Encounter: Payer: Self-pay | Admitting: Physician Assistant

## 2015-07-26 ENCOUNTER — Ambulatory Visit: Payer: Self-pay | Admitting: Physician Assistant

## 2015-07-26 VITALS — BP 118/64 | HR 77 | Temp 97.5°F | Ht 64.5 in | Wt 207.2 lb

## 2015-07-26 DIAGNOSIS — F32A Depression, unspecified: Secondary | ICD-10-CM

## 2015-07-26 DIAGNOSIS — E118 Type 2 diabetes mellitus with unspecified complications: Secondary | ICD-10-CM

## 2015-07-26 DIAGNOSIS — F329 Major depressive disorder, single episode, unspecified: Secondary | ICD-10-CM

## 2015-07-26 DIAGNOSIS — F17210 Nicotine dependence, cigarettes, uncomplicated: Secondary | ICD-10-CM | POA: Insufficient documentation

## 2015-07-26 DIAGNOSIS — E1142 Type 2 diabetes mellitus with diabetic polyneuropathy: Secondary | ICD-10-CM

## 2015-07-26 DIAGNOSIS — E669 Obesity, unspecified: Secondary | ICD-10-CM

## 2015-07-26 DIAGNOSIS — K5900 Constipation, unspecified: Secondary | ICD-10-CM

## 2015-07-26 DIAGNOSIS — E785 Hyperlipidemia, unspecified: Secondary | ICD-10-CM

## 2015-07-26 DIAGNOSIS — I10 Essential (primary) hypertension: Secondary | ICD-10-CM

## 2015-07-26 NOTE — Progress Notes (Signed)
BP 118/64 mmHg  Pulse 77  Temp(Src) 97.5 F (36.4 C)  Ht 5' 4.5" (1.638 m)  Wt 207 lb 3.2 oz (93.985 kg)  BMI 35.03 kg/m2  SpO2 95%   Subjective:    Patient ID: Natalie Vaughan, female    DOB: 03-06-1961, 55 y.o.   MRN: [id number]  HPI: Natalie Vaughan is a 55 y.o. female presenting on 07/26/2015 for Diabetes and Hyperlipidemia   HPI   pt is feeling a bit more depressed today. Nothing in specific, just her usual issues with unhappy about her circumstances.  Pt is still going to [facility].   Pt states no new complaints  Pt is still smoking  Relevant past medical, surgical, family and social history reviewed and updated as indicated. Interim medical history since our last visit reviewed. Allergies and medications reviewed and updated.  Current outpatient prescriptions:  .  acetaminophen (TYLENOL) 500 MG chewable tablet, Chew 1,000 mg by mouth every 6 (six) hours as needed for pain., Disp: , Rfl:  .  aspirin EC 325 MG tablet, Take 1 tablet (325 mg total) by mouth daily. (Patient taking differently: Take 81 mg by mouth daily. ), Disp: 30 tablet, Rfl: 0 .  atorvastatin (LIPITOR) 20 MG tablet, Take 1 tablet (20 mg total) by mouth daily., Disp: 90 tablet, Rfl: 2 .  citalopram (CELEXA) 20 MG tablet, Take 20 mg by mouth daily., Disp: , Rfl:  .  doxepin (SINEQUAN) 10 MG capsule, Take 10 mg by mouth at bedtime., Disp: , Rfl:  .  gabapentin (NEURONTIN) 300 MG capsule, TAKE 4 Capsules  BY MOUTH TWICE DAILY, Disp: 720 capsule, Rfl: 1 .  glipiZIDE (GLUCOTROL) 10 MG tablet, Take 1 tablet (10 mg total) by mouth 2 (two) times daily before a meal., Disp: 180 tablet, Rfl: 3 .  hydrochlorothiazide (HYDRODIURIL) 25 MG tablet, TAKE 1 Tablet BY MOUTH ONCE DAILY FOR BLOOD PRESSURE, Disp: 90 tablet, Rfl: 1 .  Insulin Glargine (LANTUS SOLOSTAR) 100 UNIT/ML Solostar Pen, Inject 20 Units into the skin daily at 10 pm., Disp: 15 mL, Rfl: 11 .  lisinopril (PRINIVIL,ZESTRIL) 20 MG tablet, Take 20 mg by mouth daily.,  Disp: , Rfl:  .  metFORMIN (GLUCOPHAGE) 1000 MG tablet, TAKE 1 Tablet  BY MOUTH TWICE DAILY FOR DIABETES, Disp: 180 tablet, Rfl: 2 .  metoprolol tartrate (LOPRESSOR) 25 MG tablet, Take 1 tablet (25 mg total) by mouth 2 (two) times daily., Disp: , Rfl:    Review of Systems  Constitutional: Positive for diaphoresis and fatigue. Negative for fever, chills, appetite change and unexpected weight change.  HENT: Positive for congestion. Negative for dental problem, drooling, ear pain, facial swelling, hearing loss, mouth sores, sneezing, sore throat, trouble swallowing and voice change.   Eyes: Negative for pain, discharge, redness, itching and visual disturbance.  Respiratory: Positive for cough, shortness of breath and wheezing. Negative for choking.   Cardiovascular: Negative for chest pain, palpitations and leg swelling.  Gastrointestinal: Positive for constipation. Negative for vomiting, abdominal pain, diarrhea and blood in stool.  Endocrine: Negative for cold intolerance, heat intolerance and polydipsia.  Genitourinary: Negative for dysuria, hematuria and decreased urine volume.  Musculoskeletal: Positive for back pain and gait problem. Negative for arthralgias.  Skin: Negative for rash.  Allergic/Immunologic: Negative for environmental allergies.  Neurological: Negative for seizures, syncope, light-headedness and headaches.  Hematological: Negative for adenopathy.  Psychiatric/Behavioral: Positive for dysphoric mood and agitation. Negative for suicidal ideas. The patient is nervous/anxious.     Per HPI unless specifically indicated  above     Objective:    BP 118/64 mmHg  Pulse 77  Temp(Src) 97.5 F (36.4 C)  Ht 5' 4.5" (1.638 m)  Wt 207 lb 3.2 oz (93.985 kg)  BMI 35.03 kg/m2  SpO2 95%  Wt Readings from Last 3 Encounters:  07/26/15 207 lb 3.2 oz (93.985 kg)  07/06/15 213 lb (96.616 kg)  04/27/15 207 lb 9.6 oz (94.167 kg)    Physical Exam  Constitutional: She is oriented to  person, place, and time. She appears well-developed and well-nourished.  HENT:  Head: Normocephalic and atraumatic.  Neck: Neck supple.  Cardiovascular: Normal rate and regular rhythm.   Pulmonary/Chest: Effort normal and breath sounds normal.  Abdominal: Soft. Bowel sounds are normal. She exhibits no mass. There is no hepatosplenomegaly. There is no tenderness.  Musculoskeletal: She exhibits no edema.  Lymphadenopathy:    She has no cervical adenopathy.  Neurological: She is alert and oriented to person, place, and time.  Skin: Skin is warm and dry.  Psychiatric: She has a normal mood and affect. Her behavior is normal.  Vitals reviewed.       Assessment & Plan:   Encounter Diagnoses  Name Primary?  . Type 2 diabetes mellitus with complication, unspecified long term insulin use status ([place]) Yes  . Hyperlipidemia   . Constipation, unspecified constipation type   . Essential hypertension, benign   . Diabetic polyneuropathy associated with type 2 diabetes mellitus ([place])   . Depression   . Obesity, unspecified   . Cigarette nicotine dependence without complication    -Get fasting labs drawn tomorrow. Will call with results -continue current medications -continue with [facility] for depression -counseled about constipation and gave handout.  Recommended increasing water intake, adding fiber and regular exercise -counseled on smoking cessation -f/u 3 months.  RTO sooner prn

## 2015-07-26 NOTE — Patient Instructions (Signed)

## 2015-07-30 LAB — COMPLETE METABOLIC PANEL WITH GFR
ALT: 16 U/L (ref 6–29)
AST: 15 U/L (ref 10–35)
Albumin: 3.8 g/dL (ref 3.6–5.1)
Alkaline Phosphatase: 63 U/L (ref 33–130)
BUN: 28 mg/dL — ABNORMAL HIGH (ref 7–25)
CO2: 26 mmol/L (ref 20–31)
Calcium: 9.3 mg/dL (ref 8.6–10.4)
Chloride: 104 mmol/L (ref 98–110)
Creat: 1.13 mg/dL — ABNORMAL HIGH (ref 0.50–1.05)
GFR, Est African American: 63 mL/min (ref >=60)
GFR, Est Non African American: 54 mL/min — ABNORMAL LOW (ref >=60)
Glucose, Bld: 52 mg/dL — ABNORMAL LOW (ref 65–99)
Potassium: 4.7 mmol/L (ref 3.5–5.3)
Sodium: 139 mmol/L (ref 135–146)
Total Bilirubin: 0.4 mg/dL (ref 0.2–1.2)
Total Protein: 6.6 g/dL (ref 6.1–8.1)

## 2015-07-30 LAB — LIPID PANEL
Cholesterol: 205 mg/dL — ABNORMAL HIGH (ref 125–200)
HDL: 66 mg/dL (ref >=46)
LDL Cholesterol: 126 mg/dL (ref <130)
Total CHOL/HDL Ratio: 3.1 Ratio (ref <=5.0)
Triglycerides: 64 mg/dL (ref <150)
VLDL: 13 mg/dL (ref <30)

## 2015-07-30 LAB — HEMOGLOBIN A1C
Hgb A1c MFr Bld: 6.9 % — ABNORMAL HIGH (ref <5.7)
Mean Plasma Glucose: 151 mg/dL — ABNORMAL HIGH (ref <117)

## 2015-07-30 LAB — MICROALBUMIN, URINE: Microalb, Ur: <0.2 mg/dL

## 2015-09-15 ENCOUNTER — Encounter: Payer: Self-pay | Admitting: Physician Assistant

## 2015-09-26 ENCOUNTER — Other Ambulatory Visit: Payer: Self-pay | Admitting: Physician Assistant

## 2015-10-18 ENCOUNTER — Other Ambulatory Visit: Payer: Self-pay | Admitting: Student

## 2015-10-18 DIAGNOSIS — I10 Essential (primary) hypertension: Secondary | ICD-10-CM

## 2015-10-18 DIAGNOSIS — E785 Hyperlipidemia, unspecified: Secondary | ICD-10-CM

## 2015-10-18 DIAGNOSIS — E118 Type 2 diabetes mellitus with unspecified complications: Secondary | ICD-10-CM

## 2015-10-19 LAB — COMPLETE METABOLIC PANEL WITH GFR
ALT: 11 U/L (ref 6–29)
AST: 14 U/L (ref 10–35)
Albumin: 3.9 g/dL (ref 3.6–5.1)
Alkaline Phosphatase: 72 U/L (ref 33–130)
BUN: 21 mg/dL (ref 7–25)
CO2: 24 mmol/L (ref 20–31)
Calcium: 9.2 mg/dL (ref 8.6–10.4)
Chloride: 108 mmol/L (ref 98–110)
Creat: 1.08 mg/dL — ABNORMAL HIGH (ref 0.50–1.05)
GFR, Est African American: 66 mL/min (ref >=60)
GFR, Est Non African American: 58 mL/min — ABNORMAL LOW (ref >=60)
Glucose, Bld: 49 mg/dL — ABNORMAL LOW (ref 65–99)
Potassium: 4.9 mmol/L (ref 3.5–5.3)
Sodium: 141 mmol/L (ref 135–146)
Total Bilirubin: 0.4 mg/dL (ref 0.2–1.2)
Total Protein: 6.5 g/dL (ref 6.1–8.1)

## 2015-10-19 LAB — LIPID PANEL
Cholesterol: 162 mg/dL (ref 125–200)
HDL: 51 mg/dL (ref >=46)
LDL Cholesterol: 94 mg/dL (ref <130)
Total CHOL/HDL Ratio: 3.2 Ratio (ref <=5.0)
Triglycerides: 86 mg/dL (ref <150)
VLDL: 17 mg/dL (ref <30)

## 2015-10-19 LAB — HEMOGLOBIN A1C
Hgb A1c MFr Bld: 6.2 % — ABNORMAL HIGH (ref <5.7)
Mean Plasma Glucose: 131 mg/dL

## 2015-10-26 ENCOUNTER — Ambulatory Visit: Payer: Self-pay | Admitting: Physician Assistant

## 2015-10-26 ENCOUNTER — Encounter: Payer: Self-pay | Admitting: Physician Assistant

## 2015-10-26 VITALS — BP 132/70 | HR 75 | Temp 97.2°F | Ht 64.5 in | Wt 211.5 lb

## 2015-10-26 DIAGNOSIS — N3090 Cystitis, unspecified without hematuria: Secondary | ICD-10-CM

## 2015-10-26 DIAGNOSIS — Z1211 Encounter for screening for malignant neoplasm of colon: Secondary | ICD-10-CM

## 2015-10-26 DIAGNOSIS — R319 Hematuria, unspecified: Secondary | ICD-10-CM

## 2015-10-26 DIAGNOSIS — F32A Depression, unspecified: Secondary | ICD-10-CM

## 2015-10-26 DIAGNOSIS — F329 Major depressive disorder, single episode, unspecified: Secondary | ICD-10-CM

## 2015-10-26 DIAGNOSIS — E1142 Type 2 diabetes mellitus with diabetic polyneuropathy: Secondary | ICD-10-CM

## 2015-10-26 DIAGNOSIS — E118 Type 2 diabetes mellitus with unspecified complications: Secondary | ICD-10-CM

## 2015-10-26 DIAGNOSIS — I10 Essential (primary) hypertension: Secondary | ICD-10-CM

## 2015-10-26 DIAGNOSIS — M545 Low back pain, unspecified: Secondary | ICD-10-CM

## 2015-10-26 DIAGNOSIS — E785 Hyperlipidemia, unspecified: Secondary | ICD-10-CM

## 2015-10-26 DIAGNOSIS — R262 Difficulty in walking, not elsewhere classified: Secondary | ICD-10-CM

## 2015-10-26 DIAGNOSIS — F17210 Nicotine dependence, cigarettes, uncomplicated: Secondary | ICD-10-CM

## 2015-10-26 DIAGNOSIS — E669 Obesity, unspecified: Secondary | ICD-10-CM

## 2015-10-26 DIAGNOSIS — Z1239 Encounter for other screening for malignant neoplasm of breast: Secondary | ICD-10-CM

## 2015-10-26 LAB — POCT URINALYSIS DIPSTICK
Bilirubin, UA: NEGATIVE
Glucose, UA: NEGATIVE
Ketones, UA: NEGATIVE
Nitrite, UA: NEGATIVE
Protein, UA: NEGATIVE
Spec Grav, UA: 1.025
Urobilinogen, UA: 0.2
pH, UA: 5

## 2015-10-26 MED ORDER — FLUCONAZOLE 150 MG PO TABS: 150.0000 mg | ORAL_TABLET | ORAL | Status: DC

## 2015-10-26 MED ORDER — CIPROFLOXACIN HCL 500 MG PO TABS: 500.0000 mg | ORAL_TABLET | Freq: Two times a day (BID) | ORAL | Status: DC

## 2015-10-26 NOTE — Progress Notes (Signed)
BP 132/70 mmHg  Pulse 75  Temp(Src) 97.2 F (36.2 C)  Ht 5' 4.5" (1.638 m)  Wt 211 lb 8 oz (95.936 kg)  BMI 35.76 kg/m2  SpO2 93%   Subjective:    Patient ID: Natalie Vaughan, female    DOB: August 23, 1960, 55 y.o.   MRN: [id number]  HPI: Natalie Vaughan is a 55 y.o. female presenting on 10/26/2015 for Diabetes   HPI  [facility]  C/o frequesnt yeast infections- states one or two/month lately, blood in urine past several days, pain in the lower back, and tingling and numbess in the legs. Pt states not having much problems now with L hip pain which had really been bothering her a few months ago.  Pt states she is really worried that she is going to fall.  Pt states lowest her bs was 78- she was feeling fine. No problems with low bs.  Relevant past medical, surgical, family and social history reviewed and updated as indicated. Interim medical history since our last visit reviewed. Allergies and medications reviewed and updated.  Current outpatient prescriptions:  .  acetaminophen (TYLENOL) 500 MG chewable tablet, Chew 1,000 mg by mouth every 6 (six) hours as needed for pain., Disp: , Rfl:  .  aspirin EC 325 MG tablet, Take 1 tablet (325 mg total) by mouth daily. (Patient taking differently: Take 81 mg by mouth daily. ), Disp: 30 tablet, Rfl: 0 .  atorvastatin (LIPITOR) 20 MG tablet, Take 1 tablet (20 mg total) by mouth daily., Disp: 90 tablet, Rfl: 2 .  citalopram (CELEXA) 20 MG tablet, Take 20 mg by mouth daily., Disp: , Rfl:  .  doxepin (SINEQUAN) 10 MG capsule, Take 10 mg by mouth at bedtime., Disp: , Rfl:  .  gabapentin (NEURONTIN) 300 MG capsule, TAKE 4 Capsules  BY MOUTH TWICE DAILY, Disp: 720 capsule, Rfl: 1 .  glipiZIDE (GLUCOTROL) 10 MG tablet, Take 1 tablet (10 mg total) by mouth 2 (two) times daily before a meal., Disp: 180 tablet, Rfl: 3 .  hydrochlorothiazide (HYDRODIURIL) 25 MG tablet, TAKE 1 Tablet BY MOUTH ONCE DAILY FOR BLOOD PRESSURE, Disp:  90 tablet, Rfl: 1 .  Insulin Glargine (LANTUS SOLOSTAR) 100 UNIT/ML Solostar Pen, Inject 20 Units into the skin daily at 10 pm., Disp: 15 mL, Rfl: 11 .  lisinopril (PRINIVIL,ZESTRIL) 20 MG tablet, TAKE 1 Tablet BY MOUTH ONCE DAILY, Disp: 90 tablet, Rfl: 1 .  metFORMIN (GLUCOPHAGE) 1000 MG tablet, TAKE 1 Tablet  BY MOUTH TWICE DAILY FOR DIABETES, Disp: 180 tablet, Rfl: 2 .  metoprolol tartrate (LOPRESSOR) 25 MG tablet, TAKE 1 Tablet  BY MOUTH TWICE DAILY FOR BLOOD PRESSURE, Disp: 180 tablet, Rfl: 1  Review of Systems  Constitutional: Positive for diaphoresis and fatigue. Negative for fever, chills, appetite change and unexpected weight change.  HENT: Negative for congestion, dental problem, drooling, ear pain, facial swelling, hearing loss, mouth sores, sneezing, sore throat, trouble swallowing and voice change.   Eyes: Negative for pain, discharge, redness, itching and visual disturbance.  Respiratory: Positive for shortness of breath and wheezing. Negative for cough and choking.   Cardiovascular: Negative for chest pain, palpitations and leg swelling.  Gastrointestinal: Negative for vomiting, abdominal pain, diarrhea, constipation and blood in stool.  Endocrine: Negative for cold intolerance, heat intolerance and polydipsia.  Genitourinary: Positive for dysuria and hematuria. Negative for decreased urine volume.  Musculoskeletal: Positive for back pain. Negative for arthralgias and gait problem.  Skin: Negative for  rash.  Allergic/Immunologic: Negative for environmental allergies.  Neurological: Positive for headaches. Negative for seizures, syncope and light-headedness.  Hematological: Negative for adenopathy.  Psychiatric/Behavioral: Positive for suicidal ideas, dysphoric mood and agitation. The patient is nervous/anxious.     Per HPI unless specifically indicated above     Objective:    BP 132/70 mmHg  Pulse 75  Temp(Src) 97.2 F (36.2 C)  Ht 5' 4.5" (1.638 m)  Wt 211 lb 8 oz  (95.936 kg)  BMI 35.76 kg/m2  SpO2 93%  Wt Readings from Last 3 Encounters:  10/26/15 211 lb 8 oz (95.936 kg)  07/26/15 207 lb 3.2 oz (93.985 kg)  07/06/15 213 lb (96.616 kg)    Physical Exam  Constitutional: She is oriented to person, place, and time. She appears well-developed and well-nourished.  HENT:  Head: Normocephalic and atraumatic.  Neck: Neck supple.  Cardiovascular: Normal rate and regular rhythm.   Pulmonary/Chest: Effort normal and breath sounds normal.  Abdominal: Soft. Bowel sounds are normal. She exhibits no mass. There is no hepatosplenomegaly. There is no tenderness.  Musculoskeletal: She exhibits no edema.  Lymphadenopathy:    She has no cervical adenopathy.  Neurological: She is alert and oriented to person, place, and time. Gait abnormal.  Reflex Scores:      Patellar reflexes are 1+ on the right side and 1+ on the left side. Skin: Skin is warm and dry.  Psychiatric: Her behavior is normal.  Flat affect  Vitals reviewed.   Results for orders placed or performed in visit on 10/18/15  COMPLETE METABOLIC PANEL WITH GFR  Result Value Ref Range   Sodium 141 135 - 146 mmol/L   Potassium 4.9 3.5 - 5.3 mmol/L   Chloride 108 98 - 110 mmol/L   CO2 24 20 - 31 mmol/L   Glucose, Bld 49 (L) 65 - 99 mg/dL   BUN 21 7 - 25 mg/dL   Creat 1.08 (H) 0.50 - 1.05 mg/dL   Total Bilirubin 0.4 0.2 - 1.2 mg/dL   Alkaline Phosphatase 72 33 - 130 U/L   AST 14 10 - 35 U/L   ALT 11 6 - 29 U/L   Total Protein 6.5 6.1 - 8.1 g/dL   Albumin 3.9 3.6 - 5.1 g/dL   Calcium 9.2 8.6 - 10.4 mg/dL   GFR, Est African American 66 >=60 mL/min   GFR, Est Non African American 58 (L) >=60 mL/min  Lipid Profile  Result Value Ref Range   Cholesterol 162 125 - 200 mg/dL   Triglycerides 86 <150 mg/dL   HDL 51 >=46 mg/dL   Total CHOL/HDL Ratio 3.2 <=5.0 Ratio   VLDL 17 <30 mg/dL   LDL Cholesterol 94 <130 mg/dL  HgB A1c  Result Value Ref Range   Hgb A1c MFr Bld 6.2 (H) <5.7 %   Mean Plasma  Glucose 131 mg/dL      Assessment & Plan:   Encounter Diagnoses  Name Primary?  . Type 2 diabetes mellitus with complication, unspecified long term insulin use status ([place]) Yes  . Diabetic polyneuropathy associated with type 2 diabetes mellitus ([place])   . Hematuria   . Bilateral low back pain without sciatica   . Screening for breast cancer   . Special screening for malignant neoplasms, colon   . Difficulty walking   . Cystitis   . Cigarette nicotine dependence without complication   . Obesity, unspecified   . Hyperlipidemia   . Depression   . 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  Pt counseled to hold atorvastatin for that month. -rx cipro for urine infection.  Do not start the diflucan until the cipro is finished -F/u 28months.  RTO sooner prn

## 2015-10-26 NOTE — Patient Instructions (Addendum)
Decrease lantus insulin to 18 units daily  Do not take your atorvastatin (generic lipitor) for the month that you are taking the fluconazole (generic diflucan)  Cipro (antibiotic) for urine  Return stool test  We will call with informationon mammogram and physical therapy

## 2015-11-09 ENCOUNTER — Ambulatory Visit (HOSPITAL_COMMUNITY)
Admission: RE | Admit: 2015-11-09 | Discharge: 2015-11-09 | Disposition: A | Discharge status: Home / Self Care | Payer: Self-pay | Source: Ambulatory Visit | Attending: Physician Assistant | Admitting: Physician Assistant

## 2015-11-29 LAB — IFOBT (OCCULT BLOOD): IFOBT: NEGATIVE

## 2015-12-20 ENCOUNTER — Other Ambulatory Visit: Payer: Self-pay | Admitting: Physician Assistant

## 2016-01-16 ENCOUNTER — Telehealth (HOSPITAL_COMMUNITY): Payer: Self-pay | Admitting: Physical Therapy

## 2016-01-16 NOTE — Telephone Encounter (Signed)
FOTO Patient rescheduled due to lack of transportation, advised pt to fill out Financial assistance paperwork now and mail it back in also suggested that she call [facility] today and schedule transportation with them. NF 01/16/16

## 2016-01-17 ENCOUNTER — Other Ambulatory Visit: Payer: Self-pay

## 2016-01-17 DIAGNOSIS — I10 Essential (primary) hypertension: Secondary | ICD-10-CM

## 2016-01-17 DIAGNOSIS — E785 Hyperlipidemia, unspecified: Secondary | ICD-10-CM

## 2016-01-17 DIAGNOSIS — E1142 Type 2 diabetes mellitus with diabetic polyneuropathy: Secondary | ICD-10-CM

## 2016-01-17 DIAGNOSIS — E118 Type 2 diabetes mellitus with unspecified complications: Secondary | ICD-10-CM

## 2016-01-18 ENCOUNTER — Ambulatory Visit (HOSPITAL_COMMUNITY): Payer: Self-pay | Admitting: Physical Therapy

## 2016-01-26 ENCOUNTER — Ambulatory Visit: Payer: Self-pay | Admitting: Physician Assistant

## 2016-01-26 ENCOUNTER — Encounter: Payer: Self-pay | Admitting: Physician Assistant

## 2016-01-26 VITALS — BP 140/78 | HR 75 | Ht 64.5 in | Wt 214.0 lb

## 2016-01-26 DIAGNOSIS — I10 Essential (primary) hypertension: Secondary | ICD-10-CM

## 2016-01-26 DIAGNOSIS — F32A Depression, unspecified: Secondary | ICD-10-CM

## 2016-01-26 DIAGNOSIS — E118 Type 2 diabetes mellitus with unspecified complications: Secondary | ICD-10-CM

## 2016-01-26 DIAGNOSIS — E669 Obesity, unspecified: Secondary | ICD-10-CM

## 2016-01-26 DIAGNOSIS — E1142 Type 2 diabetes mellitus with diabetic polyneuropathy: Secondary | ICD-10-CM

## 2016-01-26 DIAGNOSIS — F17210 Nicotine dependence, cigarettes, uncomplicated: Secondary | ICD-10-CM

## 2016-01-26 DIAGNOSIS — E785 Hyperlipidemia, unspecified: Secondary | ICD-10-CM

## 2016-01-26 DIAGNOSIS — F329 Major depressive disorder, single episode, unspecified: Secondary | ICD-10-CM

## 2016-01-26 MED ORDER — CITALOPRAM HYDROBROMIDE 20 MG PO TABS: 20.0000 mg | ORAL_TABLET | Freq: Every day | ORAL | 1 refills | Status: AC

## 2016-01-26 MED ORDER — HYDROCHLOROTHIAZIDE 25 MG PO TABS: ORAL_TABLET | ORAL | 2 refills | Status: AC

## 2016-01-26 NOTE — Progress Notes (Signed)
BP 140/78 (BP Location: Left Arm, Patient Position: Sitting, Cuff Size: Normal)   Pulse 75   Ht 5' 4.5" (1.638 m)   Wt 214 lb (97.1 kg)   SpO2 96%   BMI 36.17 kg/m    Subjective:    Patient ID: Maredith Slavick, female    DOB: 12-08-1960, 55 y.o.   MRN: [id number]  HPI: Parmida Cogbill is a 55 y.o. female presenting on 01/26/2016 for Diabetes and Peripheral Neuropathy   HPI Pt did not get labs drawn.  She says she can go after appointment today b/c all she ate was coffee.  Pt says she is doing well.   [facility]  Pt says she has appointment for PT later this month    Relevant past medical, surgical, family and social history reviewed and updated as indicated. Interim medical history since our last visit reviewed. Allergies and medications reviewed and updated.    Current Outpatient Prescriptions:  .  acetaminophen (TYLENOL) 500 MG chewable tablet, Chew 1,000 mg by mouth every 6 (six) hours as needed for pain., Disp: , Rfl:  .  aspirin EC 325 MG tablet, Take 1 tablet (325 mg total) by mouth daily. (Patient taking differently: Take 81 mg by mouth daily. ), Disp: 30 tablet, Rfl: 0 .  atorvastatin (LIPITOR) 20 MG tablet, TAKE 1 Tablet BY MOUTH ONCE DAILY, Disp: 90 tablet, Rfl: 2 .  citalopram (CELEXA) 20 MG tablet, Take 20 mg by mouth daily., Disp: , Rfl:  .  doxepin (SINEQUAN) 10 MG capsule, Take 10 mg by mouth at bedtime., Disp: , Rfl:  .  gabapentin (NEURONTIN) 300 MG capsule, TAKE 4 Capsules  BY MOUTH TWICE DAILY, Disp: 720 capsule, Rfl: 1 .  glipiZIDE (GLUCOTROL) 10 MG tablet, Take 1 tablet (10 mg total) by mouth 2 (two) times daily before a meal., Disp: 180 tablet, Rfl: 3 .  Insulin Glargine (LANTUS SOLOSTAR) 100 UNIT/ML Solostar Pen, Inject 20 Units into the skin daily at 10 pm. (Patient taking differently: Inject 18 Units into the skin daily at 10 pm. ), Disp: 15 mL, Rfl: 11 .  lisinopril (PRINIVIL,ZESTRIL) 20 MG tablet, TAKE 1 Tablet BY MOUTH ONCE  DAILY, Disp: 90 tablet, Rfl: 1 .  metFORMIN (GLUCOPHAGE) 1000 MG tablet, TAKE 1 Tablet  BY MOUTH TWICE DAILY FOR DIABETES, Disp: 180 tablet, Rfl: 2 .  metoprolol tartrate (LOPRESSOR) 25 MG tablet, TAKE 1 Tablet  BY MOUTH TWICE DAILY FOR BLOOD PRESSURE, Disp: 180 tablet, Rfl: 1 .  hydrochlorothiazide (HYDRODIURIL) 25 MG tablet, TAKE 1 Tablet BY MOUTH ONCE DAILY FOR BLOOD PRESSURE (Patient not taking: Reported on 01/26/2016), Disp: 90 tablet, Rfl: 2  Review of Systems  Constitutional: Positive for fatigue. Negative for appetite change, chills, diaphoresis, fever and unexpected weight change.  HENT: Positive for congestion. Negative for drooling, ear pain, facial swelling, hearing loss, mouth sores, sneezing, sore throat, trouble swallowing and voice change.   Eyes: Negative for pain, discharge, redness, itching and visual disturbance.  Respiratory: Negative for cough, choking, shortness of breath and wheezing.   Cardiovascular: Negative for chest pain, palpitations and leg swelling.  Gastrointestinal: Negative for abdominal pain, blood in stool, constipation, diarrhea and vomiting.  Endocrine: Negative for cold intolerance, heat intolerance and polydipsia.  Genitourinary: Negative for decreased urine volume, dysuria and hematuria.  Musculoskeletal: Positive for back pain and gait problem. Negative for arthralgias.  Skin: Negative for rash.  Allergic/Immunologic: Negative for environmental allergies.  Neurological: Negative for seizures, syncope, light-headedness  and headaches.  Hematological: Negative for adenopathy.  Psychiatric/Behavioral: Positive for agitation, dysphoric mood and suicidal ideas. The patient is nervous/anxious.     Per HPI unless specifically indicated above     Objective:    BP 140/78 (BP Location: Left Arm, Patient Position: Sitting, Cuff Size: Normal)   Pulse 75   Ht 5' 4.5" (1.638 m)   Wt 214 lb (97.1 kg)   SpO2 96%   BMI 36.17 kg/m   Wt Readings from Last 3  Encounters:  01/26/16 214 lb (97.1 kg)  10/26/15 211 lb 8 oz (95.9 kg)  07/26/15 207 lb 3.2 oz (94 kg)    Physical Exam  Constitutional: She is oriented to person, place, and time. She appears well-developed and well-nourished.  HENT:  Head: Normocephalic and atraumatic.  Neck: Neck supple.  Cardiovascular: Normal rate and regular rhythm.   Pulmonary/Chest: Effort normal and breath sounds normal.  Abdominal: Soft. Bowel sounds are normal. She exhibits no mass. There is no hepatosplenomegaly. There is no tenderness.  Musculoskeletal: She exhibits no edema.  Lymphadenopathy:    She has no cervical adenopathy.  Neurological: She is alert and oriented to person, place, and time.  Skin: Skin is warm and dry.  Psychiatric: She has a normal mood and affect. Her behavior is normal.  Vitals reviewed.  Foot exam done     Assessment & Plan:   Encounter Diagnoses  Name Primary?  . Type 2 diabetes mellitus with complication, unspecified long term insulin use status ([place]) Yes  . Essential hypertension, benign   . Diabetic polyneuropathy associated with type 2 diabetes mellitus ([place])   . Hyperlipidemia   . Obesity, unspecified   . Cigarette nicotine dependence without complication   . Depression     -Transfer [facility]ram rx to [facility] since it is now available there. -Get back on [facility]ctz- pt needs [facility] updated.  She says she was sent papers in the mail but didn't get them b/c someone is going through her mail. -Pt to get labs drawn today when leaves office.  Will call with results next week -F/u 3 months.  RTO sooner prn

## 2016-01-27 LAB — COMPLETE METABOLIC PANEL WITH GFR
ALT: 14 U/L (ref 6–29)
AST: 16 U/L (ref 10–35)
Albumin: 3.9 g/dL (ref 3.6–5.1)
Alkaline Phosphatase: 78 U/L (ref 33–130)
BUN: 17 mg/dL (ref 7–25)
CO2: 23 mmol/L (ref 20–31)
Calcium: 9.9 mg/dL (ref 8.6–10.4)
Chloride: 108 mmol/L (ref 98–110)
Creat: 1.04 mg/dL (ref 0.50–1.05)
GFR, Est African American: 69 mL/min (ref >=60)
GFR, Est Non African American: 60 mL/min (ref >=60)
Glucose, Bld: 102 mg/dL — ABNORMAL HIGH (ref 65–99)
Potassium: 5.1 mmol/L (ref 3.5–5.3)
Sodium: 141 mmol/L (ref 135–146)
Total Bilirubin: 0.5 mg/dL (ref 0.2–1.2)
Total Protein: 6.9 g/dL (ref 6.1–8.1)

## 2016-01-27 LAB — LIPID PANEL
Cholesterol: 177 mg/dL (ref 125–200)
HDL: 56 mg/dL (ref >=46)
LDL Cholesterol: 101 mg/dL (ref <130)
Total CHOL/HDL Ratio: 3.2 Ratio (ref <=5.0)
Triglycerides: 99 mg/dL (ref <150)
VLDL: 20 mg/dL (ref <30)

## 2016-01-27 LAB — HEMOGLOBIN A1C
Hgb A1c MFr Bld: 5.3 % (ref <5.7)
Mean Plasma Glucose: 105 mg/dL

## 2016-01-30 ENCOUNTER — Other Ambulatory Visit: Payer: Self-pay | Admitting: Physician Assistant

## 2016-01-30 DIAGNOSIS — I10 Essential (primary) hypertension: Secondary | ICD-10-CM

## 2016-01-30 DIAGNOSIS — E785 Hyperlipidemia, unspecified: Secondary | ICD-10-CM

## 2016-01-30 DIAGNOSIS — E118 Type 2 diabetes mellitus with unspecified complications: Secondary | ICD-10-CM

## 2016-02-01 ENCOUNTER — Ambulatory Visit (HOSPITAL_COMMUNITY): Payer: Self-pay | Attending: Physician Assistant | Admitting: Physical Therapy

## 2016-02-01 DIAGNOSIS — R262 Difficulty in walking, not elsewhere classified: Secondary | ICD-10-CM

## 2016-02-01 DIAGNOSIS — M5416 Radiculopathy, lumbar region: Secondary | ICD-10-CM

## 2016-02-01 DIAGNOSIS — M6281 Muscle weakness (generalized): Secondary | ICD-10-CM

## 2016-02-01 NOTE — Therapy (Signed)
[place], [place], [address] Phone: [phone]   Fax:  [phone]  Physical Therapy Evaluation  Patient Details  Name: Natalie Vaughan MRN: [id number] Date of Birth: 07/30/1960 Referring Provider: Soyla Dryer   Encounter Date: 02/01/2016      PT End of Session - 02/01/16 1109    Visit Number 1   Number of Visits 4   Date for PT Re-Evaluation 03/02/16   Authorization Type self pay   Authorization - Visit Number 1   Authorization - Number of Visits 4   PT Start Time [id number]   PT Stop Time 1113   PT Time Calculation (min) 38 min   Activity Tolerance Patient tolerated treatment well   Behavior During Therapy [facility] for tasks assessed/performed      Past Medical History:  Diagnosis Date  . Anxiety   . Depression   . Diabetes mellitus without complication ([place])   . Neuropathy [facility])     Past Surgical History:  Procedure Laterality Date  . BREAST LUMPECTOMY Bilateral age 58   benign    There were no vitals filed for this visit.       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  Her pain is constant.  She states that occasionally, about once a day she has pain going down to her Rt foot.     Pertinent History DM, OA    Limitations Lifting;Standing;Walking;House hold activities;Sitting   How long can you sit comfortably? 30-60 minutes    How long can you stand comfortably? less than 15 minutes    How long can you walk comfortably? walks with a cane; walks for short distances only    Patient Stated Goals less pain; to be able to stand and walk longer    Currently in Pain? Yes   Pain Score 7   highest is a 9/10 lowest is a 6/10    Pain Location Back   Pain Orientation Lower   Pain Descriptors / Indicators Aching   Pain Type Chronic pain   Pain Onset More than a month ago   Pain Frequency Constant   Aggravating Factors  twisting; reaching up    Pain Relieving Factors lie down    Effect of Pain on Daily Activities increases   Multiple Pain Sites No            [facility] PT Assessment - 02/01/16 0001      Assessment   Medical Diagnosis Low back pain   Referring Provider Soyla Dryer    Onset Date/Surgical Date --  2005   Hand Dominance Right   Next MD Visit 04/24/2016   Prior Therapy none     Precautions   Precautions None     Restrictions   Weight Bearing Restrictions No     Balance Screen   Has the patient fallen in the past 6 months Yes   How many times? 1   Has the patient had a decrease in activity level because of a fear of falling?  Yes   Is the patient reluctant to leave their home because of a fear of falling?  No     Home Environment   Living Environment Private residence   Type of [place] to enter   Entrance Stairs-Number of Steps 7   Entrance Stairs-Rails Right  Prior Function   Level of Independence Independent with basic ADLs   Vocation Unemployed   Leisure none     Cognition   Overall Cognitive Status Within Functional Limits for tasks assessed     Observation/Other Assessments   Focus on Therapeutic Outcomes (FOTO)  41     Posture/Postural Control   Posture/Postural Control Postural limitations   Postural Limitations --   Posture Comments protruding abdominal mm      ROM / Strength   AROM / PROM / Strength AROM;Strength     AROM   AROM Assessment Site Lumbar   Lumbar Flexion fingers past knees   bending down increases pain increases pain in legs    Lumbar Extension 28  pain in legs   Lumbar - Right Side Bend 20  pain in legs   Lumbar - Left Side Bend 30  pain in legs      Strength   Strength Assessment Site Hip;Knee;Ankle   Right/Left Hip Right;Left   Right Hip Flexion 3-/5   Right Hip Extension 3-/5   Right Hip ABduction 3-/5   Left Hip Flexion 3-/5   Left Hip Extension 3-/5   Left Hip ABduction  3-/5   Right/Left Knee Right;Left   Right Knee Flexion 3/5   Right Knee Extension 5/5   Left Knee Flexion 3/5   Left Knee Extension 4/5   Right/Left Ankle Right;Left   Right Ankle Dorsiflexion 5/5   Left Ankle Dorsiflexion 5/5                   [facility] Adult PT Treatment/Exercise - 02/01/16 0001      Exercises   Exercises Lumbar     Lumbar Exercises: Stretches   Single Knee to Chest Stretch 3 reps;20 seconds   Lower Trunk Rotation 5 reps   Pelvic Tilt 5 reps     Lumbar Exercises: Supine   Ab Set 10 reps   Bridge 10 reps     Lumbar Exercises: Sidelying   Hip Abduction 10 reps     Lumbar Exercises: Prone   Other Prone Lumbar Exercises glut sets; heel squeeze x 10                 PT Education - 02/01/16 1124    Education provided Yes   Education Details HEP   Person(s) Educated Patient   Methods Explanation;Tactile cues;Verbal cues;Handout   Comprehension Verbalized understanding;Returned demonstration          PT Short Term Goals - 02/01/16 1124      PT SHORT TERM GOAL #1   Title Pt pain in her low back to be no greater than a 5/10 to allow pt to stand for 15 minutes without pain to make a small meal    Time 2   Period Weeks   Status New     PT SHORT TERM GOAL #2   Title Pt radicular pain to be no further than her knee to demonstrate decreased nerve irritation    Time 2   Period Weeks   Status New     PT SHORT TERM GOAL #3   Title Pt to be able to verbalize and demonstrate proper body mechanics for decreased stress on her low back    Time 2   Period Weeks   Status New           PT Long Term Goals - 02/01/16 1209      PT LONG TERM GOAL #1   Title Pt  low back pain to be no greater than a 3/10 to allow pt to stand for 25 minutes to make a meal    Time 4   Period Weeks   Status New     PT LONG TERM GOAL #2   Title Pt to be able to walk for 30 minutes without increased pain to allow pt to shop in comfort    Time 4   Period  Weeks   Status New     PT LONG TERM GOAL #3   Title Pt to state that she has had no radicular symptoms for the past week to demonstrate no increased nerve irritation    Time 4   Period Weeks               Plan - 02/01/16 1110    Clinical Impression Statement Natalie Vaughan is a 55 yo female who has been referred to skilled physical therapy for back pain.  She has had chronic back for years.  Examination demonstrates decreased ROM, decreased strength, decreased functional tolerance.  She has had a recent MRI at [facility] but is unsure of the results.  Ms. Lichtenberg will benefit from skilled physical therapy to address theses issues and  maximize her functional ability    Rehab Potential Good   PT Frequency 1x / week   PT Duration 4 weeks   PT Treatment/Interventions ADLs/Self Care Home Management;Stair training;Functional mobility training;Therapeutic activities;Therapeutic exercise;Balance training;Neuromuscular re-education;Patient/family education;Manual techniques   PT Next Visit Plan go over body mechanics and proper lifting.  Begin SLR; prone hip extension, heel raises and squats. give instruction for all as pt will be coming once a week due to self pay status.       Patient will benefit from skilled therapeutic intervention in order to improve the following deficits and impairments:  Abnormal gait, Decreased activity tolerance, Decreased balance, Decreased mobility, Decreased strength, Difficulty walking, Pain, Improper body mechanics, Postural dysfunction, Obesity  Visit Diagnosis: Radiculopathy, lumbar region - Plan: PT plan of care cert/re-cert  Difficulty in walking - Plan: PT plan of care cert/re-cert  Muscular weakness - Plan: PT plan of care cert/re-cert     Problem List Patient Active Problem List   Diagnosis Date Noted  . Cigarette nicotine dependence without complication [id number]  . Type 2 diabetes mellitus with complication ([place]) [id number]  . Essential  hypertension, benign 05/04/2015  . Hyperlipidemia 05/04/2015  . Obesity, unspecified 05/04/2015  . Diabetic polyneuropathy associated with type 2 diabetes mellitus ([place]) 05/04/2015  . MDD (major depressive disorder) [facility]) 03/13/2014   Rayetta Humphrey, PT CLT [phone] 02/01/2016, 12:16 PM  [place] [address] [place], [place], [address] Phone: [phone]   Fax:  [phone]  Name: Natalie Vaughan MRN: [id number] Date of Birth: [birth year]

## 2016-02-01 NOTE — Patient Instructions (Addendum)
Pelvic Tilt    Flatten back by tightening stomach muscles and buttocks. Repeat _10___ times per set. Do __1__ sets per session. Do _2___ sessions per day.  http://orth.exer.us/134   Copyright  VHI. All rights reserved.  Isometric Abdominal    Lying on back with knees bent, tighten stomach by pressing elbows down. Hold _3-5___ seconds. Repeat __10__ times per set. Do 1____ sets per session. Do __2__ sessions per day.  http://orth.exer.us/1086   Copyright  VHI. All rights reserved.  Bridging    Slowly raise buttocks from floor, keeping stomach tight. Repeat __10__ times per set. Do ___1_ sets per session. Do ___2_ sessions per day.  http://orth.exer.us/1096   Copyright  VHI. All rights reserved.  Strengthening: Hip Abduction (Side-Lying)    Tighten muscles on front of left thigh, then lift leg __15__ inches from surface, keeping knee locked.  Repeat __10__ times per set. Do 1____ sets per session. Do _2___ sessions per day.  http://orth.exer.us/622   Copyright  VHI. All rights reserved.  Gluteal Sets    Tighten buttocks while pressing pelvis to floor. Hold _10___ seconds. Repeat _10___ times per set. Do ___1_ sets per session. Do _2___ sessions per day.  http://orth.exer.us/104   Copyright  VHI. All rights reserved.  Heel Squeeze (Prone)    Abdomen supported, bend knees and gently squeeze heels together. Hold __5__ seconds. Repeat __10__ times per set. Do _1___ sets per session. Do _2___ sessions per day.  http://orth.exer.us/1080   Copyright  VHI. All rights reserved.  Knee-to-Chest Stretch: Unilateral    With hand behind right knee, pull knee in to chest until a comfortable stretch is felt in lower back and buttocks. Keep back relaxed. Hold __20__ seconds. Repeat __3__ times per set. Do _1___ sets per session. Do __2__ sessions per day.  http://orth.exer.us/126   Copyright  VHI. All rights reserved.  Lumbar Rotation (Non-Weight  Bearing)    Feet on floor, slowly rock knees from side to side in small, pain-free range of motion. Allow lower back to rotate slightly. Repeat __10__ times per set. Do _1___ sets per session. Do __2__ sessions per day.  http://orth.exer.us/160   Copyright  VHI. All rights reserved.

## 2016-02-09 ENCOUNTER — Other Ambulatory Visit: Payer: Self-pay | Admitting: Physician Assistant

## 2016-02-09 ENCOUNTER — Ambulatory Visit (HOSPITAL_COMMUNITY): Payer: Self-pay | Admitting: Physical Therapy

## 2016-02-09 DIAGNOSIS — M5416 Radiculopathy, lumbar region: Secondary | ICD-10-CM

## 2016-02-09 DIAGNOSIS — R262 Difficulty in walking, not elsewhere classified: Secondary | ICD-10-CM

## 2016-02-09 NOTE — Patient Instructions (Addendum)
Straight Leg Raise    Tighten stomach and slowly raise locked right leg __15__ inches from floor. Repeat 10____ times per set. Do ____1 sets per session. Do ___2_ sessions per day.  http://orth.exer.us/1102   Copyright  VHI. All rights reserved.  Unilateral Isometric Hip Flexion    Tighten stomach and raise right knee to outstretched arm. Push gently, keeping arm straight, trunk rigid. Hold ___5_ seconds. Repeat __10__ times per set. Do ___1_ sets per session. Do ___2_ sessions per day.  http://orth.exer.us/1098   Copyright  VHI. All rights reserved.  Straight Leg Raise (Prone)    Abdomen and head supported, keep left knee locked and raise leg at hip. Avoid arching low back. Repeat 10____ times per set. Do _1___ sets per session. Do _2___ sessions per day.  http://orth.exer.us/1112   Copyright  VHI. All rights reserved.  Heel Raise: Bilateral (Standing)    Rise on balls of feet. Repeat __10__ times per set. Do _1___ sets per session. Do 2____ sessions per day.  http://orth.exer.us/38   Copyright  VHI. All rights reserved.  Functional Quadriceps: Chair Squat    Keeping feet flat on floor, shoulder width apart, squat as low as is comfortable. Use support as necessary. Repeat _10___ times per set. Do ___1_ sets per session. Do __2__ sessions per day.  http://orth.exer.us/736   Copyright  VHI. All rights reserved.

## 2016-02-09 NOTE — Therapy (Addendum)
[facility] [address] [place], [place], [address] Phone: [phone]   Fax:  [phone]  Physical Therapy Treatment  Patient Details  Name: Natalie Vaughan MRN: [id number] Date of Birth: 12-09-1960 Referring Provider: Soyla Dryer   Encounter Date: 02/09/2016      PT End of Session - 02/09/16 1409    Visit Number 2   Number of Visits 4   Date for PT Re-Evaluation 03/02/16   Authorization Type self pay   Authorization - Visit Number 2   Authorization - Number of Visits 4   PT Start Time [id number]   PT Stop Time 1424   PT Time Calculation (min) 39 min   Activity Tolerance Patient tolerated treatment well   Behavior During Therapy [facility] for tasks assessed/performed      Past Medical History:  Diagnosis Date  . Anxiety   . Depression   . Diabetes mellitus without complication ([place])   . Neuropathy [facility])     Past Surgical History:  Procedure Laterality Date  . BREAST LUMPECTOMY Bilateral age 47   benign    There were no vitals filed for this visit.      Subjective Assessment - 02/09/16 1349    Subjective Pt states that she has been doing her exercises and feels better.    Pertinent History DM, OA    Limitations Lifting;Standing;Walking;House hold activities;Sitting   How long can you sit comfortably? 30-60 minutes    How long can you stand comfortably? less than 15 minutes    How long can you walk comfortably? walks with a cane; walks for short distances only    Patient Stated Goals less pain; to be able to stand and walk longer    Currently in Pain? Yes   Pain Score 3    Pain Location Back   Pain Orientation Lower   Pain Descriptors / Indicators Aching   Pain Radiating Towards into both legs to her knee level    Pain Onset More than a month ago   Aggravating Factors  walking    Pain Relieving Factors pain medication    Multiple Pain Sites No                         [facility] Adult PT Treatment/Exercise  - 02/09/16 0001      Exercises   Exercises Lumbar     Lumbar Exercises: Stretches   Single Knee to Chest Stretch 3 reps;30 seconds     Lumbar Exercises: Aerobic   Stationary Bike nustep level 3 hills x 10"     Lumbar Exercises: Standing   Heel Raises 10 reps   Functional Squats 10 reps     Lumbar Exercises: Supine   Bridge 10 reps   Straight Leg Raise 10 reps   Isometric Hip Flexion 10 reps     Lumbar Exercises: Prone   Straight Leg Raise 10 reps   Other Prone Lumbar Exercises glut sets; heel squeeze x 10      nustep hills 3; level 3 x 10'           PT Education - 02/09/16 1409    Education provided Yes   Education Details advanced HEP   Person(s) Educated Patient   Methods Explanation;Handout   Comprehension Verbalized understanding;Returned demonstration          PT Short Term Goals - 02/09/16 1418      PT SHORT TERM GOAL #1   Title Pt  pain in her low back to be no greater than a 5/10 to allow pt to stand for 15 minutes without pain to make a small meal    Time 2   Period Weeks   Status Achieved     PT SHORT TERM GOAL #2   Title Pt radicular pain to be no further than her knee to demonstrate decreased nerve irritation    Time 2   Period Weeks   Status On-going     PT SHORT TERM GOAL #3   Title Pt to be able to verbalize and demonstrate proper body mechanics for decreased stress on her low back    Time 2   Period Weeks   Status On-going           PT Long Term Goals - 02/09/16 1419      PT LONG TERM GOAL #1   Title Pt low back pain to be no greater than a 3/10 to allow pt to stand for 25 minutes to make a meal    Time 4   Period Weeks   Status On-going     PT LONG TERM GOAL #2   Title Pt to be able to walk for 30 minutes without increased pain to allow pt to shop in comfort    Time 4   Period Weeks   Status On-going     PT LONG TERM GOAL #3   Title Pt to state that she has had no radicular symptoms for the past week to demonstrate  no increased nerve irritation    Time 4   Period Weeks   Status On-going               Plan - 02/09/16 1410    Clinical Impression Statement Pt instructed in weight bearing and prone exercises with handouts given for pt to follow at home.  Reviewed proper body mechanics for gettin in and out of bed as well as lifting.   Added 3-D hip excursion to promote movement of back.  Treatment ended with nustep to promote LE strengthening    Rehab Potential Good   PT Frequency 1x / week   PT Duration 4 weeks   PT Treatment/Interventions ADLs/Self Care Home Management;Stair training;Functional mobility training;Therapeutic activities;Therapeutic exercise;Balance training;Neuromuscular re-education;Patient/family education;Manual techniques   PT Next Visit Plan begin lunging activity review lifting; begin postural t-band exercises    PT Home Exercise Plan added prone and supine SLR, 3 D hip excursion       Patient will benefit from skilled therapeutic intervention in order to improve the following deficits and impairments:  Abnormal gait, Decreased activity tolerance, Decreased balance, Decreased mobility, Decreased strength, Difficulty walking, Pain, Improper body mechanics, Postural dysfunction, Obesity  Visit Diagnosis: Radiculopathy, lumbar region  Difficulty in walking     Problem List Patient Active Problem List   Diagnosis Date Noted  . Cigarette nicotine dependence without complication [id number]  . Type 2 diabetes mellitus with complication ([place]) [id number]  . Essential hypertension, benign 05/04/2015  . Hyperlipidemia 05/04/2015  . Obesity, unspecified 05/04/2015  . Diabetic polyneuropathy associated with type 2 diabetes mellitus ([place]) 05/04/2015  . MDD (major depressive disorder) ([place]) 03/13/2014   Rayetta Humphrey, PT CLT [phone] 02/09/2016, 2:24 PM  [facility] [address] [place], [place], [address] Phone:  [phone]   Fax:  [phone]  Name: Aracelys Leuck MRN: [id number] Date of Birth: November 13, 1960

## 2016-02-14 ENCOUNTER — Telehealth (HOSPITAL_COMMUNITY): Payer: Self-pay

## 2016-02-14 NOTE — Telephone Encounter (Signed)
02/14/16 pt left a message to cx for Wed but no reason was given

## 2016-02-15 ENCOUNTER — Ambulatory Visit (HOSPITAL_COMMUNITY): Payer: Self-pay | Admitting: Physical Therapy

## 2016-02-16 ENCOUNTER — Other Ambulatory Visit: Payer: Self-pay | Admitting: Physician Assistant

## 2016-02-16 MED ORDER — GABAPENTIN 300 MG PO CAPS: ORAL_CAPSULE | ORAL | 1 refills | Status: AC

## 2016-02-16 MED ORDER — METFORMIN HCL 1000 MG PO TABS: ORAL_TABLET | ORAL | 2 refills | Status: AC

## 2016-02-16 MED ORDER — METOPROLOL TARTRATE 25 MG PO TABS: ORAL_TABLET | ORAL | 1 refills | Status: AC

## 2016-02-16 MED ORDER — LISINOPRIL 20 MG PO TABS
20.0000 mg | ORAL_TABLET | Freq: Every day | ORAL | 1 refills | Status: AC
Start: 2016-02-16 — End: ?

## 2016-02-22 ENCOUNTER — Ambulatory Visit (HOSPITAL_COMMUNITY): Payer: Self-pay | Admitting: Physical Therapy

## 2016-02-29 ENCOUNTER — Ambulatory Visit (HOSPITAL_COMMUNITY): Payer: Self-pay | Attending: Physician Assistant | Admitting: Physical Therapy

## 2016-02-29 DIAGNOSIS — M6281 Muscle weakness (generalized): Secondary | ICD-10-CM | POA: Insufficient documentation

## 2016-02-29 DIAGNOSIS — R262 Difficulty in walking, not elsewhere classified: Secondary | ICD-10-CM | POA: Insufficient documentation

## 2016-02-29 DIAGNOSIS — M5416 Radiculopathy, lumbar region: Secondary | ICD-10-CM | POA: Insufficient documentation

## 2016-02-29 NOTE — Patient Instructions (Addendum)
Lifting Principles  .Maintain proper posture and head alignment. .Slide object as close as possible before lifting. .Move obstacles out of the way. .Test before lifting; ask for help if too heavy. .Tighten stomach muscles without holding breath. .Use smooth movements; do not jerk. .Use legs to do the work, and pivot with feet. .Distribute the work load symmetrically and close to the center of trunk. .Push instead of pull whenever possible.  Copyright  VHI. All rights reserved.  Deep Squat    Squat and lift with both arms held against upper trunk. Tighten stomach muscles without holding breath. Use smooth movements to avoid jerking.  Copyright  VHI. All rights reserved.  Reducing Load    Move heavy items one at a time, or move portions of the contents.   Copyright  VHI. All rights reserved.  Getting Into / Out of Bed    Lower self to lie down on one side by raising legs and lowering head at the same time. Use arms to assist moving without twisting. Bend both knees to roll onto back if desired. To sit up, start from lying on side, and use same move-ments in reverse. Keep trunk aligned with legs.   Copyright  VHI. All rights reserved.  Brushing Teeth     Place one foot on ledge and one hand on counter. Bend other knee slightly to keep back straight.  Copyright  VHI. All rights reserved.  Housework - Vacuuming    Hold the vacuum with arm held at side. Step back and forth to move it, keeping head up. Avoid twisting.   Copyright  VHI. All rights reserved.

## 2016-02-29 NOTE — Therapy (Signed)
[facility] [address] [place], [place], [address] Phone: [phone]   Fax:  [phone]  Physical Therapy Treatment  Patient Details  Name: Merly Hinkson MRN: [id number] Date of Birth: [birth year] Referring Provider: Soyla Dryer   Encounter Date: 02/29/2016      PT End of Session - 02/29/16 1112    Visit Number 3   Number of Visits 3   Date for PT Re-Evaluation 03/02/16   Authorization Type self pay   Authorization - Visit Number 3   Authorization - Number of Visits 3   PT Start Time 7989   PT Stop Time 1111   PT Time Calculation (min) 35 min   Activity Tolerance Patient tolerated treatment well   Behavior During Therapy [facility] for tasks assessed/performed      Past Medical History:  Diagnosis Date  . Anxiety   . Depression   . Diabetes mellitus without complication ([place])   . Neuropathy [facility])     Past Surgical History:  Procedure Laterality Date  . BREAST LUMPECTOMY Bilateral age 28   benign    There were no vitals filed for this visit.      Subjective Assessment - 02/29/16 1042    Subjective Pt states that she has been doing her exercises and feels better.    Pertinent History DM, OA    Limitations Lifting;Standing;Walking;House hold activities;Sitting   How long can you sit comfortably? 30-60 minutes    How long can you stand comfortably? less than 15 minutes    How long can you walk comfortably? walks with a cane; walks for short distances only    Patient Stated Goals less pain; to be able to stand and walk longer    Currently in Pain? Yes   Pain Score 6    Pain Location Leg   Pain Orientation Right;Left   Pain Descriptors / Indicators Sharp   Pain Radiating Towards Both hips down to her feet    Pain Onset More than a month ago   Aggravating Factors  not sure   Pain Relieving Factors not sure   Effect of Pain on Daily Activities increases    Multiple Pain Sites No  no back pain today              [facility] PT Assessment - 02/29/16 0001      Assessment   Medical Diagnosis Low back pain   Referring Provider Soyla Dryer    Onset Date/Surgical Date --  2005   Hand Dominance Right   Next MD Visit 04/24/2016   Prior Therapy none     Precautions   Precautions None     Restrictions   Weight Bearing Restrictions No     Balance Screen   Has the patient fallen in the past 6 months Yes   How many times? 1   Has the patient had a decrease in activity level because of a fear of falling?  Yes   Is the patient reluctant to leave their home because of a fear of falling?  No     Home Environment   Living Environment Private residence   Type of [place] to enter   Entrance Stairs-Number of Steps 7   Entrance Stairs-Rails Right     Prior Function   Level of Independence Independent with basic ADLs   Vocation Unemployed   Leisure none     Cognition   Overall Cognitive Status Within Functional Limits for  tasks assessed     Observation/Other Assessments   Focus on Therapeutic Outcomes (FOTO)  53  was 41; 100 = no limitation      Posture/Postural Control   Posture/Postural Control Postural limitations   Posture Comments protruding abdominal mm      ROM / Strength   AROM / PROM / Strength AROM;Strength     AROM   Lumbar Flexion fingers past knees   bending down increases pain increases pain in legs    Lumbar Extension 35  was 28   Lumbar - Right Side Bend 25  was 25   Lumbar - Left Side Bend 35   35     Strength   Right Hip Flexion 4-/5  was 3-/5   Right Hip Extension 4-/5  was 3-   Right Hip ABduction 4/5  was 3-/5    Left Hip Flexion 4/5  was 3-/5   Left Hip Extension 3/5  was 3-   Left Hip ABduction 3+/5  was 3-/5    Right Knee Flexion 5/5  was 3/5   Right Knee Extension 5/5   Left Knee Flexion 5/5  was 3/5   Left Knee Extension 4/5   Right Ankle Dorsiflexion 5/5   Left Ankle Dorsiflexion 5/5                      [facility] Adult PT Treatment/Exercise - 02/29/16 0001      Exercises   Exercises Lumbar     Lumbar Exercises: Stretches   Single Knee to Chest Stretch    Prone on Elbows Stretch 4 reps;60 seconds     Lumbar Exercises: Aerobic   Stationary Bike      Lumbar Exercises: Standing   Heel Raises --   Functional Squats --   Scapular Retraction Strengthening;Both;10 reps   Theraband Level (Scapular Retraction) Level 3 (Green)   Row Strengthening;Both;10 reps   Theraband Level (Row) Level 3 (Green)   Shoulder Extension Strengthening;Both;10 reps   Theraband Level (Shoulder Extension) Level 3 (Green)   Other Standing Lumbar Exercises body mechanics for lifting      Lumbar Exercises: Supine   Bridge --   Straight Leg Raise --   Isometric Hip Flexion --     Lumbar Exercises: Prone   Straight Leg Raise 10 reps   Other Prone Lumbar Exercises glut sets; heel squeeze x 10                 PT Education - 02/29/16 1112    Education provided Yes   Education Details body mechanics; posture and t-band exercises    Person(s) Educated Patient   Methods Explanation;Handout;Verbal cues   Comprehension Verbalized understanding          PT Short Term Goals - 02/29/16 1115      PT SHORT TERM GOAL #1   Title Pt pain in her low back to be no greater than a 5/10 to allow pt to stand for 15 minutes without pain to make a small meal    Time 2   Period Weeks   Status Achieved     PT SHORT TERM GOAL #2   Title Pt radicular pain to be no further than her knee to demonstrate decreased nerve irritation    Time 2   Period Weeks   Status Not Met     PT SHORT TERM GOAL #3   Title Pt to be able to verbalize and demonstrate proper body mechanics for decreased stress  on her low back   able to verbalize but does not always complete this    Time 2   Period Weeks   Status Achieved           PT Long Term Goals - 02/29/16 1116      PT LONG TERM GOAL #1   Title Pt low back pain to be no  greater than a 3/10 to allow pt to stand for 25 minutes to make a meal    Time 4   Period Weeks   Status On-going     PT LONG TERM GOAL #2   Title Pt to be able to walk for 30 minutes without increased pain to allow pt to shop in comfort    Time 4   Period Weeks   Status On-going     PT LONG TERM GOAL #3   Title Pt to state that she has had no radicular symptoms for the past week to demonstrate no increased nerve irritation    Time 4   Period Weeks   Status On-going               Plan - 02/29/16 1113    Clinical Impression Statement Pt reassessed with good improvement in mm strength, ROM and some improvement in pain.  PT is self pay and has been given multiple exercises, postrue and body mechanic sheet for home program.  Will discharge to HEP at this time.    Rehab Potential Good   PT Frequency 1x / week   PT Duration 4 weeks   PT Treatment/Interventions ADLs/Self Care Home Management;Stair training;Functional mobility training;Therapeutic activities;Therapeutic exercise;Balance training;Neuromuscular re-education;Patient/family education;Manual techniques   PT Next Visit Plan Discharge pt to HEP    PT Home Exercise Plan added prone and supine SLR, 3 D hip excursion       Patient will benefit from skilled therapeutic intervention in order to improve the following deficits and impairments:  Abnormal gait, Decreased activity tolerance, Decreased balance, Decreased mobility, Decreased strength, Difficulty walking, Pain, Improper body mechanics, Postural dysfunction, Obesity  Visit Diagnosis: Radiculopathy, lumbar region  Difficulty in walking  Muscular weakness     Problem List Patient Active Problem List   Diagnosis Date Noted  . Cigarette nicotine dependence without complication 02/54/2706  . Type 2 diabetes mellitus with complication ([place]) 23/76/2831  . Essential hypertension, benign 05/04/2015  . Hyperlipidemia 05/04/2015  . Obesity, unspecified 05/04/2015  .  Diabetic polyneuropathy associated with type 2 diabetes mellitus ([place]) 05/04/2015  . MDD (major depressive disorder) 03/13/2014    Rayetta Humphrey, PT CLT [phone] 02/29/2016, 11:17 AM  [facility] [place], [place], [address] Phone: [phone]   Fax:  [phone]  Name: Senie Lanese MRN: [id number] Date of Birth: November 15, 1960  PHYSICAL THERAPY DISCHARGE SUMMARY  Visits from Start of Care: 3  Current functional level related to goals / functional outcomes: See above   Remaining deficits: See above   Education / Equipment: HEP, posture and body mechanics Plan: Patient agrees to discharge.  Patient goals were partially met. Patient is being discharged due to financial reasons.  ?????       Rayetta Humphrey, [place] CLT [phone]

## 2016-03-07 ENCOUNTER — Ambulatory Visit (HOSPITAL_COMMUNITY): Payer: Self-pay | Admitting: Physical Therapy

## 2016-03-14 ENCOUNTER — Encounter (HOSPITAL_COMMUNITY): Payer: Self-pay | Admitting: Physical Therapy

## 2016-04-18 ENCOUNTER — Other Ambulatory Visit: Payer: Self-pay

## 2016-04-18 DIAGNOSIS — E118 Type 2 diabetes mellitus with unspecified complications: Secondary | ICD-10-CM

## 2016-04-18 DIAGNOSIS — I10 Essential (primary) hypertension: Secondary | ICD-10-CM

## 2016-04-18 DIAGNOSIS — E785 Hyperlipidemia, unspecified: Secondary | ICD-10-CM

## 2016-04-20 LAB — COMPREHENSIVE METABOLIC PANEL
ALT: 14 U/L (ref 6–29)
AST: 12 U/L (ref 10–35)
Albumin: 3.2 g/dL — ABNORMAL LOW (ref 3.6–5.1)
Alkaline Phosphatase: 68 U/L (ref 33–130)
BUN: 14 mg/dL (ref 7–25)
CO2: 27 mmol/L (ref 20–31)
Calcium: 9.3 mg/dL (ref 8.6–10.4)
Chloride: 102 mmol/L (ref 98–110)
Creat: 1.17 mg/dL — ABNORMAL HIGH (ref 0.50–1.05)
Glucose, Bld: 190 mg/dL — ABNORMAL HIGH (ref 65–99)
Potassium: 4.8 mmol/L (ref 3.5–5.3)
Sodium: 138 mmol/L (ref 135–146)
Total Bilirubin: 0.7 mg/dL (ref 0.2–1.2)
Total Protein: 6.7 g/dL (ref 6.1–8.1)

## 2016-04-20 LAB — LIPID PANEL
Cholesterol: 134 mg/dL (ref <200)
HDL: 35 mg/dL — ABNORMAL LOW (ref >50)
LDL Cholesterol: 77 mg/dL (ref <100)
Total CHOL/HDL Ratio: 3.8 Ratio (ref <5.0)
Triglycerides: 110 mg/dL (ref <150)
VLDL: 22 mg/dL (ref <30)

## 2016-04-21 LAB — HEMOGLOBIN A1C
Hgb A1c MFr Bld: 8.3 % — ABNORMAL HIGH (ref <5.7)
Mean Plasma Glucose: 192 mg/dL

## 2016-04-25 ENCOUNTER — Ambulatory Visit: Payer: Self-pay | Admitting: Physician Assistant

## 2016-04-25 ENCOUNTER — Encounter: Payer: Self-pay | Admitting: Physician Assistant

## 2016-04-25 VITALS — BP 126/68 | HR 79 | Temp 97.3°F | Ht 64.5 in | Wt 205.4 lb

## 2016-04-25 DIAGNOSIS — F32A Depression, unspecified: Secondary | ICD-10-CM

## 2016-04-25 DIAGNOSIS — E785 Hyperlipidemia, unspecified: Secondary | ICD-10-CM

## 2016-04-25 DIAGNOSIS — N189 Chronic kidney disease, unspecified: Secondary | ICD-10-CM

## 2016-04-25 DIAGNOSIS — E1142 Type 2 diabetes mellitus with diabetic polyneuropathy: Secondary | ICD-10-CM

## 2016-04-25 DIAGNOSIS — F329 Major depressive disorder, single episode, unspecified: Secondary | ICD-10-CM

## 2016-04-25 DIAGNOSIS — I10 Essential (primary) hypertension: Secondary | ICD-10-CM

## 2016-04-25 DIAGNOSIS — F17210 Nicotine dependence, cigarettes, uncomplicated: Secondary | ICD-10-CM

## 2016-04-25 DIAGNOSIS — E669 Obesity, unspecified: Secondary | ICD-10-CM

## 2016-04-25 DIAGNOSIS — Z6834 Body mass index (BMI) 34.0-34.9, adult: Secondary | ICD-10-CM

## 2016-04-25 DIAGNOSIS — E118 Type 2 diabetes mellitus with unspecified complications: Secondary | ICD-10-CM

## 2016-04-25 NOTE — Patient Instructions (Signed)
   Over the counter prilosec or nexium    Gastroesophageal Reflux Scan A gastroesophageal reflux scan is a procedure that is used to check for gastroesophageal reflux, which is the backward flow of stomach contents into the tube that carries food from the mouth to the stomach (esophagus). The scan can also show if any stomach contents are inhaled (aspirated) into your lungs. You may need this scan if you have symptoms such as heartburn, vomiting, swallowing problems, or regurgitation. Regurgitation means that swallowed food is returning from the stomach to the esophagus. For this scan, you will drink a liquid that contains a small amount of a radioactive substance (tracer). A scanner with a camera that detects the radioactive tracer is used to see if any of the material backs up into your esophagus. Tell a health care provider about:  Any allergies you have.  All medicines you are taking, including vitamins, herbs, eye drops, creams, and over-the-counter medicines.  Any blood disorders you have.  Any surgeries you have had.  Any medical conditions you have.  If you are pregnant or you think that you may be pregnant.  If you are breastfeeding. What are the risks? Generally, this is a safe procedure. However, problems may occur, including:  Exposure to radiation (a small amount).  Allergic reaction to the radioactive substance. This is rare. What happens before the procedure?  Ask your health care provider about changing or stopping your regular medicines. This is especially important if you are taking diabetes medicines or blood thinners.  Follow your health care provider's instructions about eating or drinking restrictions. What happens during the procedure?  You will be asked to drink a liquid that contains a small amount of a radioactive tracer. This liquid will probably be similar to orange juice.  You will assume a position lying on your back.  A series of images will be  taken of your esophagus and upper stomach.  You may be asked to move into different positions to help determine if reflux occurs more often when you are in specific positions.  For adults, an abdominal binder with an inflatable cuff may be placed on the belly (abdomen). This may be used to increase abdominal pressure. More images will be taken to see if the increased pressure causes reflux to occur. The procedure may vary among health care providers and hospitals. What happens after the procedure?  Return to your normal activities and your normal diet as directed by your health care provider.  The radioactive tracer will leave your body over the next few days. Drink enough fluid to keep your urine clear or pale yellow. This will help to flush the tracer out of your body.  It is your responsibility to obtain your test results. Ask your health care provider or the department performing the test when and how you will get your results. This information is not intended to replace advice given to you by your health care provider. Make sure you discuss any questions you have with your health care provider. Document Released: 06/28/2005 Document Revised: 01/30/2016 Document Reviewed: 02/16/2014 Elsevier Interactive Patient Education  2017 [employer].

## 2016-04-25 NOTE — Progress Notes (Signed)
BP 126/68 (BP Location: Left Arm, Patient Position: Sitting, Cuff Size: Large)   Pulse 79   Temp 97.3 F (36.3 C) (Other (Comment))   Ht 5' 4.5" (1.638 m)   Wt 205 lb 6.4 oz (93.2 kg)   SpO2 94%   BMI 34.71 kg/m    Subjective:    Patient ID: Natalie Vaughan, female    DOB: Oct 12, 1960, 55 y.o.   MRN: [id number]  HPI: Natalie Vaughan is a 55 y.o. female presenting on 04/25/2016 for Diabetes (concerned about a place on left foot); Hyperlipidemia; and Hypertension   HPI  Pt has medicaid now.  She is still seeing psychiatrist.    She is taking some kind of antiacid but doesn't know what kind   Relevant past medical, surgical, family and social history reviewed and updated as indicated. Interim medical history since our last visit reviewed. Allergies and medications reviewed and updated.   Current Outpatient Prescriptions:  .  acetaminophen (TYLENOL) 500 MG chewable tablet, Chew 1,000 mg by mouth every 6 (six) hours as needed for pain., Disp: , Rfl:  .  aspirin EC 325 MG tablet, Take 1 tablet (325 mg total) by mouth daily. (Patient taking differently: Take 81 mg by mouth daily. ), Disp: 30 tablet, Rfl: 0 .  atorvastatin (LIPITOR) 20 MG tablet, TAKE 1 Tablet BY MOUTH ONCE DAILY, Disp: 90 tablet, Rfl: 2 .  citalopram (CELEXA) 20 MG tablet, Take 1 tablet (20 mg total) by mouth daily., Disp: 90 tablet, Rfl: 1 .  doxepin (SINEQUAN) 10 MG capsule, Take 10 mg by mouth at bedtime as needed. , Disp: , Rfl:  .  gabapentin (NEURONTIN) 300 MG capsule, TAKE 4 Capsules  BY MOUTH TWICE DAILY, Disp: 720 capsule, Rfl: 1 .  hydrochlorothiazide (HYDRODIURIL) 25 MG tablet, TAKE 1 Tablet BY MOUTH ONCE DAILY FOR BLOOD PRESSURE, Disp: 90 tablet, Rfl: 2 .  Insulin Glargine (LANTUS SOLOSTAR) 100 UNIT/ML Solostar Pen, Inject 20 Units into the skin daily at 10 pm. (Patient taking differently: Inject 18 Units into the skin daily at 10 pm. ), Disp: 15 mL, Rfl: 11 .  lisinopril (PRINIVIL,ZESTRIL) 20 MG tablet, Take 1  tablet (20 mg total) by mouth daily., Disp: 90 tablet, Rfl: 1 .  metFORMIN (GLUCOPHAGE) 1000 MG tablet, TAKE 1 Tablet  BY MOUTH TWICE DAILY FOR DIABETES, Disp: 180 tablet, Rfl: 2 .  metoprolol tartrate (LOPRESSOR) 25 MG tablet, TAKE 1 Tablet  BY MOUTH TWICE DAILY FOR BLOOD PRESSURE, Disp: 180 tablet, Rfl: 1  Review of Systems  Constitutional: Positive for fatigue. Negative for appetite change, chills, diaphoresis, fever and unexpected weight change.  HENT: Positive for congestion, dental problem and sneezing. Negative for drooling, ear pain, facial swelling, hearing loss, mouth sores, sore throat, trouble swallowing and voice change.   Eyes: Negative for pain, discharge, redness, itching and visual disturbance.  Respiratory: Positive for cough. Negative for choking, shortness of breath and wheezing.   Cardiovascular: Negative for chest pain, palpitations and leg swelling.  Gastrointestinal: Negative for abdominal pain, blood in stool, constipation, diarrhea and vomiting.  Endocrine: Negative for cold intolerance, heat intolerance and polydipsia.  Genitourinary: Negative for decreased urine volume, dysuria and hematuria.  Musculoskeletal: Positive for back pain. Negative for arthralgias and gait problem.  Skin: Negative for rash.  Allergic/Immunologic: Negative for environmental allergies.  Neurological: Negative for seizures, syncope, light-headedness and headaches.  Hematological: Negative for adenopathy.  Psychiatric/Behavioral: Positive for dysphoric mood. Negative for agitation and suicidal ideas. The patient is nervous/anxious.  Per HPI unless specifically indicated above     Objective:    BP 126/68 (BP Location: Left Arm, Patient Position: Sitting, Cuff Size: Large)   Pulse 79   Temp 97.3 F (36.3 C) (Other (Comment))   Ht 5' 4.5" (1.638 m)   Wt 205 lb 6.4 oz (93.2 kg)   SpO2 94%   BMI 34.71 kg/m   Wt Readings from Last 3 Encounters:  04/25/16 205 lb 6.4 oz (93.2 kg)   01/26/16 214 lb (97.1 kg)  10/26/15 211 lb 8 oz (95.9 kg)    Physical Exam  Constitutional: She is oriented to person, place, and time. She appears well-developed and well-nourished.  HENT:  Head: Normocephalic and atraumatic.  Neck: Neck supple.  Cardiovascular: Normal rate and regular rhythm.   Pulmonary/Chest: Effort normal and breath sounds normal.  Abdominal: Soft. Bowel sounds are normal. She exhibits no mass. There is no hepatosplenomegaly. There is no tenderness.  Musculoskeletal: She exhibits no edema.  Lymphadenopathy:    She has no cervical adenopathy.  Neurological: She is alert and oriented to person, place, and time.  Skin: Skin is warm and dry.  Psychiatric: She has a normal mood and affect. Her behavior is normal.  Vitals reviewed.   Results for orders placed or performed in visit on 04/18/16  HgB A1c  Result Value Ref Range   Hgb A1c MFr Bld 8.3 (H) <5.7 %   Mean Plasma Glucose 192 mg/dL  Comprehensive Metabolic Panel (CMET)  Result Value Ref Range   Sodium 138 135 - 146 mmol/L   Potassium 4.8 3.5 - 5.3 mmol/L   Chloride 102 98 - 110 mmol/L   CO2 27 20 - 31 mmol/L   Glucose, Bld 190 (H) 65 - 99 mg/dL   BUN 14 7 - 25 mg/dL   Creat 1.17 (H) 0.50 - 1.05 mg/dL   Total Bilirubin 0.7 0.2 - 1.2 mg/dL   Alkaline Phosphatase 68 33 - 130 U/L   AST 12 10 - 35 U/L   ALT 14 6 - 29 U/L   Total Protein 6.7 6.1 - 8.1 g/dL   Albumin 3.2 (L) 3.6 - 5.1 g/dL   Calcium 9.3 8.6 - 10.4 mg/dL  Lipid Profile  Result Value Ref Range   Cholesterol 134 <200 mg/dL   Triglycerides 110 <150 mg/dL   HDL 35 (L) >50 mg/dL   Total CHOL/HDL Ratio 3.8 <5.0 Ratio   VLDL 22 <30 mg/dL   LDL Cholesterol 77 <100 mg/dL      Assessment & Plan:   Encounter Diagnoses  Name Primary?  . Type 2 diabetes mellitus with complication, unspecified long term insulin use status ([place]) Yes  . Essential hypertension, benign   . Diabetic polyneuropathy associated with type 2 diabetes mellitus ([place])    . Hyperlipidemia, unspecified hyperlipidemia type   . Cigarette nicotine dependence without complication   . Depression, unspecified depression type   . Class 1 obesity with body mass index (BMI) of 34.0 to 34.9 in adult, unspecified obesity type, unspecified whether serious comorbidity present   . Chronic kidney disease, unspecified CKD stage      -Reviewed labs with pt.  Glipizide was d/c after labs in [place] due to a1c of 5.3.  a1c back up a lot now.  Discussed with pt and she admits eating more things she isn't supposed to.  Will not change meds today but pt will watch her diabetic diet and get a1c back down. -continue current medications -she will establish with new  PCP now that she has medicare

## 2016-10-11 DIAGNOSIS — G4733 Obstructive sleep apnea (adult) (pediatric): Secondary | ICD-10-CM | POA: Diagnosis not present

## 2016-10-11 DIAGNOSIS — E784 Other hyperlipidemia: Secondary | ICD-10-CM | POA: Diagnosis not present

## 2016-10-11 DIAGNOSIS — I10 Essential (primary) hypertension: Secondary | ICD-10-CM | POA: Diagnosis not present

## 2016-10-11 DIAGNOSIS — E1142 Type 2 diabetes mellitus with diabetic polyneuropathy: Secondary | ICD-10-CM | POA: Diagnosis not present

## 2016-10-11 DIAGNOSIS — F3289 Other specified depressive episodes: Secondary | ICD-10-CM | POA: Diagnosis not present

## 2016-10-11 DIAGNOSIS — Z79899 Other long term (current) drug therapy: Secondary | ICD-10-CM | POA: Diagnosis not present

## 2016-10-11 DIAGNOSIS — E668 Other obesity: Secondary | ICD-10-CM | POA: Diagnosis not present

## 2016-10-29 DIAGNOSIS — F329 Major depressive disorder, single episode, unspecified: Secondary | ICD-10-CM | POA: Diagnosis not present

## 2016-12-27 DIAGNOSIS — Z1211 Encounter for screening for malignant neoplasm of colon: Secondary | ICD-10-CM | POA: Diagnosis not present

## 2016-12-27 DIAGNOSIS — D125 Benign neoplasm of sigmoid colon: Secondary | ICD-10-CM | POA: Diagnosis not present

## 2016-12-27 DIAGNOSIS — D120 Benign neoplasm of cecum: Secondary | ICD-10-CM | POA: Diagnosis not present

## 2016-12-27 DIAGNOSIS — E119 Type 2 diabetes mellitus without complications: Secondary | ICD-10-CM | POA: Diagnosis not present

## 2016-12-27 DIAGNOSIS — G629 Polyneuropathy, unspecified: Secondary | ICD-10-CM | POA: Diagnosis not present

## 2016-12-27 DIAGNOSIS — I10 Essential (primary) hypertension: Secondary | ICD-10-CM | POA: Diagnosis not present

## 2016-12-27 DIAGNOSIS — K635 Polyp of colon: Secondary | ICD-10-CM | POA: Diagnosis not present

## 2017-01-17 DIAGNOSIS — I10 Essential (primary) hypertension: Secondary | ICD-10-CM | POA: Diagnosis not present

## 2017-01-17 DIAGNOSIS — R05 Cough: Secondary | ICD-10-CM | POA: Diagnosis not present

## 2017-01-17 DIAGNOSIS — N182 Chronic kidney disease, stage 2 (mild): Secondary | ICD-10-CM | POA: Diagnosis not present

## 2017-01-17 DIAGNOSIS — R590 Localized enlarged lymph nodes: Secondary | ICD-10-CM | POA: Diagnosis not present

## 2017-01-17 DIAGNOSIS — J40 Bronchitis, not specified as acute or chronic: Secondary | ICD-10-CM | POA: Diagnosis not present

## 2017-01-17 DIAGNOSIS — R918 Other nonspecific abnormal finding of lung field: Secondary | ICD-10-CM | POA: Diagnosis not present

## 2017-02-07 DIAGNOSIS — R918 Other nonspecific abnormal finding of lung field: Secondary | ICD-10-CM | POA: Diagnosis not present

## 2017-02-07 DIAGNOSIS — R7611 Nonspecific reaction to tuberculin skin test without active tuberculosis: Secondary | ICD-10-CM | POA: Diagnosis not present

## 2017-02-18 DIAGNOSIS — F329 Major depressive disorder, single episode, unspecified: Secondary | ICD-10-CM | POA: Diagnosis not present

## 2017-03-29 ENCOUNTER — Other Ambulatory Visit: Payer: Self-pay | Admitting: *Deleted

## 2017-04-02 NOTE — Patient Outreach (Signed)
[employer] high risk screen attempted, unable to leave a message. I will try again another day.  Eulah Pont. Myrtie Neither, MSN, [facility] Gerontological Nurse Practitioner [facility] Care Management [phone]

## 2017-06-06 DIAGNOSIS — E7849 Other hyperlipidemia: Secondary | ICD-10-CM | POA: Diagnosis not present

## 2017-06-06 DIAGNOSIS — J8489 Other specified interstitial pulmonary diseases: Secondary | ICD-10-CM | POA: Diagnosis not present

## 2017-06-06 DIAGNOSIS — Z6829 Body mass index (BMI) 29.0-29.9, adult: Secondary | ICD-10-CM | POA: Diagnosis not present

## 2017-06-06 DIAGNOSIS — J84115 Respiratory bronchiolitis interstitial lung disease: Secondary | ICD-10-CM | POA: Diagnosis not present

## 2017-06-06 DIAGNOSIS — M0689 Other specified rheumatoid arthritis, multiple sites: Secondary | ICD-10-CM | POA: Diagnosis not present

## 2017-06-06 DIAGNOSIS — Z6827 Body mass index (BMI) 27.0-27.9, adult: Secondary | ICD-10-CM | POA: Diagnosis not present

## 2017-06-06 DIAGNOSIS — E1142 Type 2 diabetes mellitus with diabetic polyneuropathy: Secondary | ICD-10-CM | POA: Diagnosis not present

## 2017-06-06 DIAGNOSIS — K210 Gastro-esophageal reflux disease with esophagitis: Secondary | ICD-10-CM | POA: Diagnosis not present

## 2017-06-06 DIAGNOSIS — E668 Other obesity: Secondary | ICD-10-CM | POA: Diagnosis not present

## 2017-06-06 DIAGNOSIS — I10 Essential (primary) hypertension: Secondary | ICD-10-CM | POA: Diagnosis not present

## 2017-06-06 DIAGNOSIS — N182 Chronic kidney disease, stage 2 (mild): Secondary | ICD-10-CM | POA: Diagnosis not present

## 2017-08-06 ENCOUNTER — Other Ambulatory Visit (HOSPITAL_COMMUNITY): Payer: Self-pay | Admitting: Internal Medicine

## 2017-08-06 DIAGNOSIS — Z1231 Encounter for screening mammogram for malignant neoplasm of breast: Secondary | ICD-10-CM

## 2017-08-14 ENCOUNTER — Ambulatory Visit (HOSPITAL_COMMUNITY)
Admission: RE | Admit: 2017-08-14 | Discharge: 2017-08-14 | Disposition: A | Discharge status: Home / Self Care | Payer: Medicare HMO | Source: Ambulatory Visit | Attending: Internal Medicine | Admitting: Internal Medicine

## 2017-08-14 DIAGNOSIS — Z1231 Encounter for screening mammogram for malignant neoplasm of breast: Secondary | ICD-10-CM | POA: Insufficient documentation

## 2017-09-10 DIAGNOSIS — N182 Chronic kidney disease, stage 2 (mild): Secondary | ICD-10-CM | POA: Diagnosis not present

## 2017-09-10 DIAGNOSIS — J8489 Other specified interstitial pulmonary diseases: Secondary | ICD-10-CM | POA: Diagnosis not present

## 2017-09-10 DIAGNOSIS — I10 Essential (primary) hypertension: Secondary | ICD-10-CM | POA: Diagnosis not present

## 2017-09-10 DIAGNOSIS — M0689 Other specified rheumatoid arthritis, multiple sites: Secondary | ICD-10-CM | POA: Diagnosis not present

## 2017-09-10 DIAGNOSIS — E7849 Other hyperlipidemia: Secondary | ICD-10-CM | POA: Diagnosis not present

## 2017-09-10 DIAGNOSIS — E1142 Type 2 diabetes mellitus with diabetic polyneuropathy: Secondary | ICD-10-CM | POA: Diagnosis not present

## 2017-09-10 DIAGNOSIS — E668 Other obesity: Secondary | ICD-10-CM | POA: Diagnosis not present

## 2017-09-10 DIAGNOSIS — K210 Gastro-esophageal reflux disease with esophagitis: Secondary | ICD-10-CM | POA: Diagnosis not present

## 2017-09-10 DIAGNOSIS — Z6828 Body mass index (BMI) 28.0-28.9, adult: Secondary | ICD-10-CM | POA: Diagnosis not present

## 2018-02-05 DIAGNOSIS — Z6828 Body mass index (BMI) 28.0-28.9, adult: Secondary | ICD-10-CM | POA: Diagnosis not present

## 2018-02-05 DIAGNOSIS — Z6825 Body mass index (BMI) 25.0-25.9, adult: Secondary | ICD-10-CM | POA: Diagnosis not present

## 2018-02-05 DIAGNOSIS — M0689 Other specified rheumatoid arthritis, multiple sites: Secondary | ICD-10-CM | POA: Diagnosis not present

## 2018-02-05 DIAGNOSIS — E668 Other obesity: Secondary | ICD-10-CM | POA: Diagnosis not present

## 2018-02-05 DIAGNOSIS — Z0000 Encounter for general adult medical examination without abnormal findings: Secondary | ICD-10-CM | POA: Diagnosis not present

## 2018-02-05 DIAGNOSIS — J8489 Other specified interstitial pulmonary diseases: Secondary | ICD-10-CM | POA: Diagnosis not present

## 2018-02-05 DIAGNOSIS — E1142 Type 2 diabetes mellitus with diabetic polyneuropathy: Secondary | ICD-10-CM | POA: Diagnosis not present

## 2018-02-05 DIAGNOSIS — N182 Chronic kidney disease, stage 2 (mild): Secondary | ICD-10-CM | POA: Diagnosis not present

## 2018-02-05 DIAGNOSIS — E7849 Other hyperlipidemia: Secondary | ICD-10-CM | POA: Diagnosis not present

## 2018-02-05 DIAGNOSIS — K210 Gastro-esophageal reflux disease with esophagitis: Secondary | ICD-10-CM | POA: Diagnosis not present

## 2018-02-13 DIAGNOSIS — L089 Local infection of the skin and subcutaneous tissue, unspecified: Secondary | ICD-10-CM | POA: Diagnosis not present

## 2018-02-13 DIAGNOSIS — L819 Disorder of pigmentation, unspecified: Secondary | ICD-10-CM | POA: Diagnosis not present

## 2018-02-13 DIAGNOSIS — D485 Neoplasm of uncertain behavior of skin: Secondary | ICD-10-CM | POA: Diagnosis not present

## 2018-02-25 DIAGNOSIS — L810 Postinflammatory hyperpigmentation: Secondary | ICD-10-CM | POA: Diagnosis not present

## 2018-02-25 DIAGNOSIS — L821 Other seborrheic keratosis: Secondary | ICD-10-CM | POA: Diagnosis not present

## 2018-05-07 DIAGNOSIS — Z6830 Body mass index (BMI) 30.0-30.9, adult: Secondary | ICD-10-CM | POA: Diagnosis not present

## 2018-05-07 DIAGNOSIS — I10 Essential (primary) hypertension: Secondary | ICD-10-CM | POA: Diagnosis not present

## 2018-05-07 DIAGNOSIS — Z0000 Encounter for general adult medical examination without abnormal findings: Secondary | ICD-10-CM | POA: Diagnosis not present

## 2018-05-07 DIAGNOSIS — E1122 Type 2 diabetes mellitus with diabetic chronic kidney disease: Secondary | ICD-10-CM | POA: Diagnosis not present

## 2018-05-07 DIAGNOSIS — E785 Hyperlipidemia, unspecified: Secondary | ICD-10-CM | POA: Diagnosis not present

## 2018-06-04 DIAGNOSIS — I10 Essential (primary) hypertension: Secondary | ICD-10-CM | POA: Diagnosis not present

## 2018-06-04 DIAGNOSIS — Z6830 Body mass index (BMI) 30.0-30.9, adult: Secondary | ICD-10-CM | POA: Diagnosis not present

## 2018-06-04 DIAGNOSIS — E785 Hyperlipidemia, unspecified: Secondary | ICD-10-CM | POA: Diagnosis not present

## 2018-06-04 DIAGNOSIS — E1122 Type 2 diabetes mellitus with diabetic chronic kidney disease: Secondary | ICD-10-CM | POA: Diagnosis not present

## 2018-06-04 DIAGNOSIS — Z0000 Encounter for general adult medical examination without abnormal findings: Secondary | ICD-10-CM | POA: Diagnosis not present

## 2018-06-05 DIAGNOSIS — E11319 Type 2 diabetes mellitus with unspecified diabetic retinopathy without macular edema: Secondary | ICD-10-CM | POA: Diagnosis not present

## 2018-07-16 DIAGNOSIS — L819 Disorder of pigmentation, unspecified: Secondary | ICD-10-CM | POA: Diagnosis not present

## 2018-08-07 DIAGNOSIS — I10 Essential (primary) hypertension: Secondary | ICD-10-CM | POA: Diagnosis not present

## 2018-08-07 DIAGNOSIS — E1122 Type 2 diabetes mellitus with diabetic chronic kidney disease: Secondary | ICD-10-CM | POA: Diagnosis not present

## 2018-08-07 DIAGNOSIS — E785 Hyperlipidemia, unspecified: Secondary | ICD-10-CM | POA: Diagnosis not present

## 2018-08-07 DIAGNOSIS — Z6830 Body mass index (BMI) 30.0-30.9, adult: Secondary | ICD-10-CM | POA: Diagnosis not present

## 2018-10-01 ENCOUNTER — Other Ambulatory Visit (HOSPITAL_COMMUNITY): Payer: Self-pay | Admitting: Internal Medicine

## 2018-10-01 DIAGNOSIS — Z1231 Encounter for screening mammogram for malignant neoplasm of breast: Secondary | ICD-10-CM

## 2018-10-08 ENCOUNTER — Ambulatory Visit (HOSPITAL_COMMUNITY)
Admission: RE | Admit: 2018-10-08 | Discharge: 2018-10-08 | Disposition: A | Discharge status: Home / Self Care | Payer: Medicare HMO | Source: Ambulatory Visit | Attending: Internal Medicine | Admitting: Internal Medicine

## 2018-10-08 ENCOUNTER — Encounter (HOSPITAL_COMMUNITY): Payer: Self-pay

## 2018-10-08 ENCOUNTER — Other Ambulatory Visit: Payer: Self-pay

## 2018-10-08 DIAGNOSIS — Z1231 Encounter for screening mammogram for malignant neoplasm of breast: Secondary | ICD-10-CM | POA: Insufficient documentation

## 2018-11-06 DIAGNOSIS — E785 Hyperlipidemia, unspecified: Secondary | ICD-10-CM | POA: Diagnosis not present

## 2018-11-06 DIAGNOSIS — R1010 Upper abdominal pain, unspecified: Secondary | ICD-10-CM | POA: Diagnosis not present

## 2018-11-06 DIAGNOSIS — E1122 Type 2 diabetes mellitus with diabetic chronic kidney disease: Secondary | ICD-10-CM | POA: Diagnosis not present

## 2018-11-06 DIAGNOSIS — Z6829 Body mass index (BMI) 29.0-29.9, adult: Secondary | ICD-10-CM | POA: Diagnosis not present

## 2018-11-06 DIAGNOSIS — I10 Essential (primary) hypertension: Secondary | ICD-10-CM | POA: Diagnosis not present

## 2018-11-13 DIAGNOSIS — R1110 Vomiting, unspecified: Secondary | ICD-10-CM | POA: Diagnosis not present

## 2018-11-13 DIAGNOSIS — R1010 Upper abdominal pain, unspecified: Secondary | ICD-10-CM | POA: Diagnosis not present

## 2018-11-13 DIAGNOSIS — R109 Unspecified abdominal pain: Secondary | ICD-10-CM | POA: Diagnosis not present

## 2018-11-13 DIAGNOSIS — I700 Atherosclerosis of aorta: Secondary | ICD-10-CM | POA: Diagnosis not present

## 2019-02-09 DIAGNOSIS — Z0000 Encounter for general adult medical examination without abnormal findings: Secondary | ICD-10-CM | POA: Diagnosis not present

## 2019-02-09 DIAGNOSIS — N3021 Other chronic cystitis with hematuria: Secondary | ICD-10-CM | POA: Diagnosis not present

## 2019-02-09 DIAGNOSIS — I10 Essential (primary) hypertension: Secondary | ICD-10-CM | POA: Diagnosis not present

## 2019-02-09 DIAGNOSIS — Z6829 Body mass index (BMI) 29.0-29.9, adult: Secondary | ICD-10-CM | POA: Diagnosis not present

## 2019-02-09 DIAGNOSIS — E782 Mixed hyperlipidemia: Secondary | ICD-10-CM | POA: Diagnosis not present

## 2019-02-09 DIAGNOSIS — E1122 Type 2 diabetes mellitus with diabetic chronic kidney disease: Secondary | ICD-10-CM | POA: Diagnosis not present

## 2019-05-13 DIAGNOSIS — E782 Mixed hyperlipidemia: Secondary | ICD-10-CM | POA: Diagnosis not present

## 2019-05-13 DIAGNOSIS — E1122 Type 2 diabetes mellitus with diabetic chronic kidney disease: Secondary | ICD-10-CM | POA: Diagnosis not present

## 2019-05-13 DIAGNOSIS — Z131 Encounter for screening for diabetes mellitus: Secondary | ICD-10-CM | POA: Diagnosis not present

## 2019-05-13 DIAGNOSIS — E559 Vitamin D deficiency, unspecified: Secondary | ICD-10-CM | POA: Diagnosis not present

## 2019-05-13 DIAGNOSIS — Z6829 Body mass index (BMI) 29.0-29.9, adult: Secondary | ICD-10-CM | POA: Diagnosis not present

## 2019-05-13 DIAGNOSIS — Z0000 Encounter for general adult medical examination without abnormal findings: Secondary | ICD-10-CM | POA: Diagnosis not present

## 2019-05-13 DIAGNOSIS — D518 Other vitamin B12 deficiency anemias: Secondary | ICD-10-CM | POA: Diagnosis not present

## 2019-05-13 DIAGNOSIS — Z1389 Encounter for screening for other disorder: Secondary | ICD-10-CM | POA: Diagnosis not present

## 2019-05-13 DIAGNOSIS — N3021 Other chronic cystitis with hematuria: Secondary | ICD-10-CM | POA: Diagnosis not present

## 2019-05-13 DIAGNOSIS — Z79899 Other long term (current) drug therapy: Secondary | ICD-10-CM | POA: Diagnosis not present

## 2019-05-13 DIAGNOSIS — I10 Essential (primary) hypertension: Secondary | ICD-10-CM | POA: Diagnosis not present

## 2019-08-12 DIAGNOSIS — Z6828 Body mass index (BMI) 28.0-28.9, adult: Secondary | ICD-10-CM | POA: Diagnosis not present

## 2019-08-12 DIAGNOSIS — E782 Mixed hyperlipidemia: Secondary | ICD-10-CM | POA: Diagnosis not present

## 2019-08-12 DIAGNOSIS — N3021 Other chronic cystitis with hematuria: Secondary | ICD-10-CM | POA: Diagnosis not present

## 2019-08-12 DIAGNOSIS — E1122 Type 2 diabetes mellitus with diabetic chronic kidney disease: Secondary | ICD-10-CM | POA: Diagnosis not present

## 2019-08-12 DIAGNOSIS — I10 Essential (primary) hypertension: Secondary | ICD-10-CM | POA: Diagnosis not present

## 2019-11-25 DIAGNOSIS — N3942 Incontinence without sensory awareness: Secondary | ICD-10-CM | POA: Diagnosis not present

## 2019-11-25 DIAGNOSIS — N3021 Other chronic cystitis with hematuria: Secondary | ICD-10-CM | POA: Diagnosis not present

## 2019-11-25 DIAGNOSIS — E782 Mixed hyperlipidemia: Secondary | ICD-10-CM | POA: Diagnosis not present

## 2019-11-25 DIAGNOSIS — I10 Essential (primary) hypertension: Secondary | ICD-10-CM | POA: Diagnosis not present

## 2019-11-25 DIAGNOSIS — E1122 Type 2 diabetes mellitus with diabetic chronic kidney disease: Secondary | ICD-10-CM | POA: Diagnosis not present

## 2019-11-25 DIAGNOSIS — Z6827 Body mass index (BMI) 27.0-27.9, adult: Secondary | ICD-10-CM | POA: Diagnosis not present

## 2019-12-23 IMAGING — MG DIGITAL SCREENING BILATERAL MAMMOGRAM WITH TOMO AND CAD
6 of 12 series · 6 of 36 positions shown · non-contrast
Comparison: Previous exam(s).

ACR Breast Density Category a: The breast tissue is almost entirely
fatty.

CLINICAL DATA: Screening.

EXAM:
DIGITAL SCREENING BILATERAL MAMMOGRAM WITH TOMO AND CAD

[L MLO synth-2D]
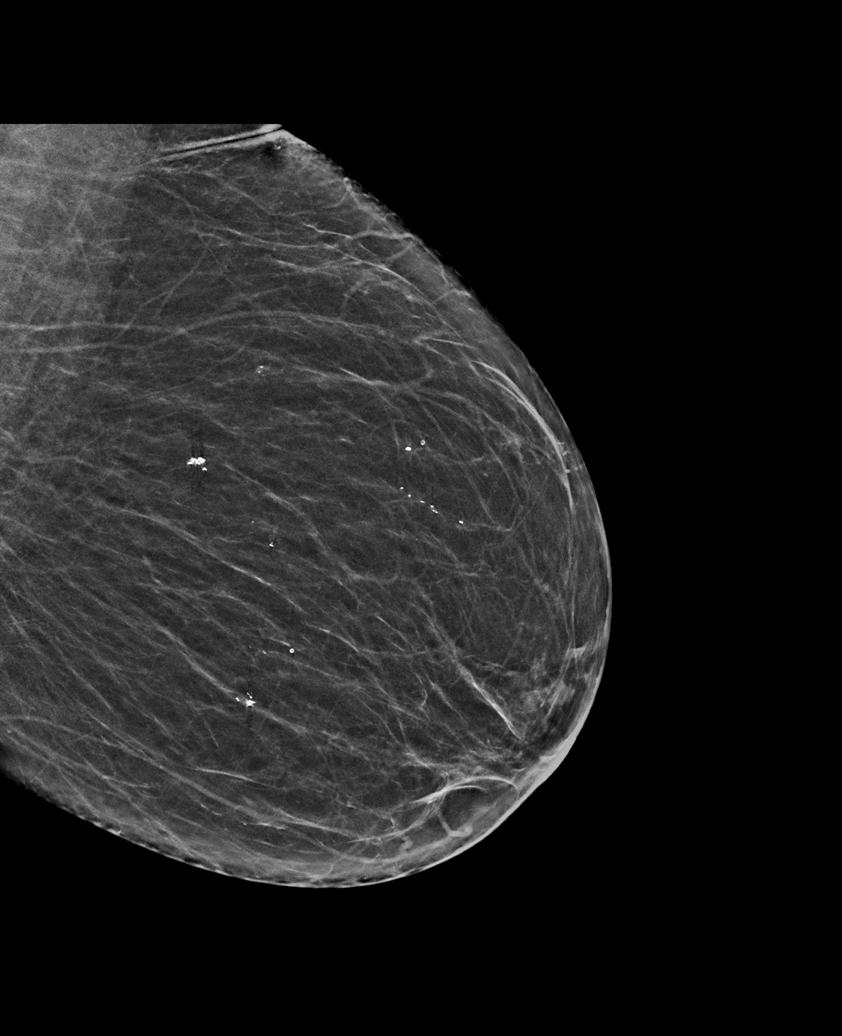

[R CC synth-2D]
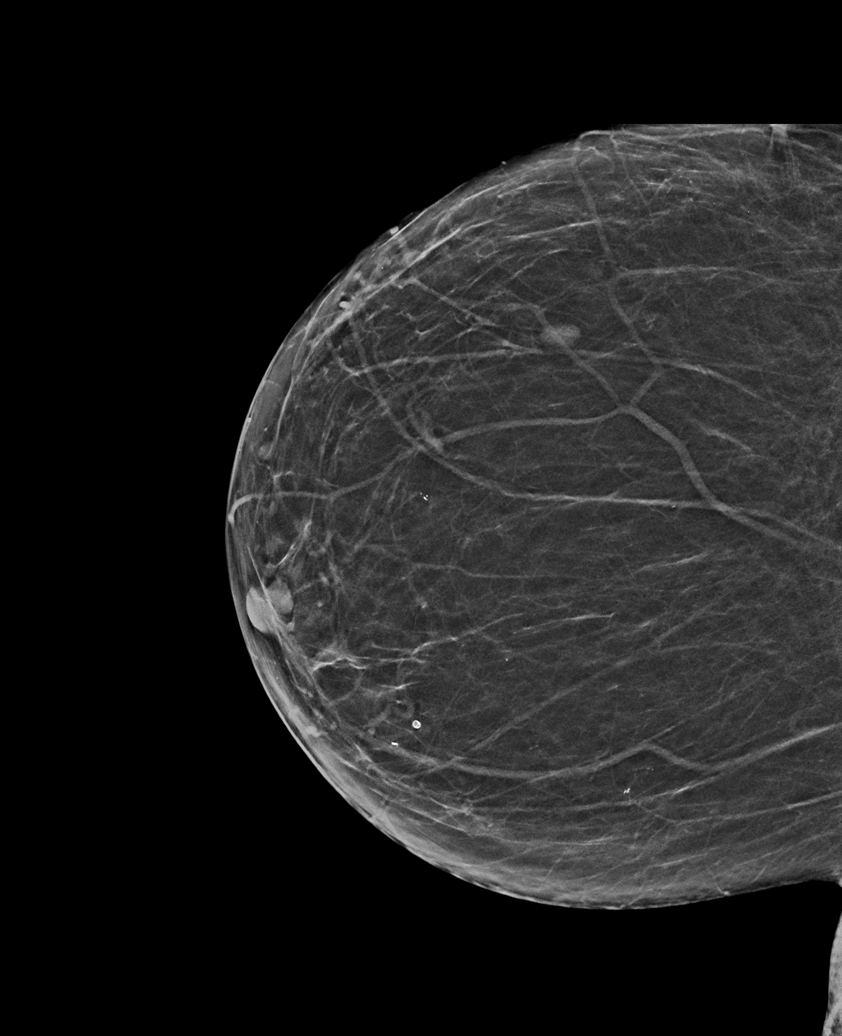

[R MLO synth-2D (1 of 2)]
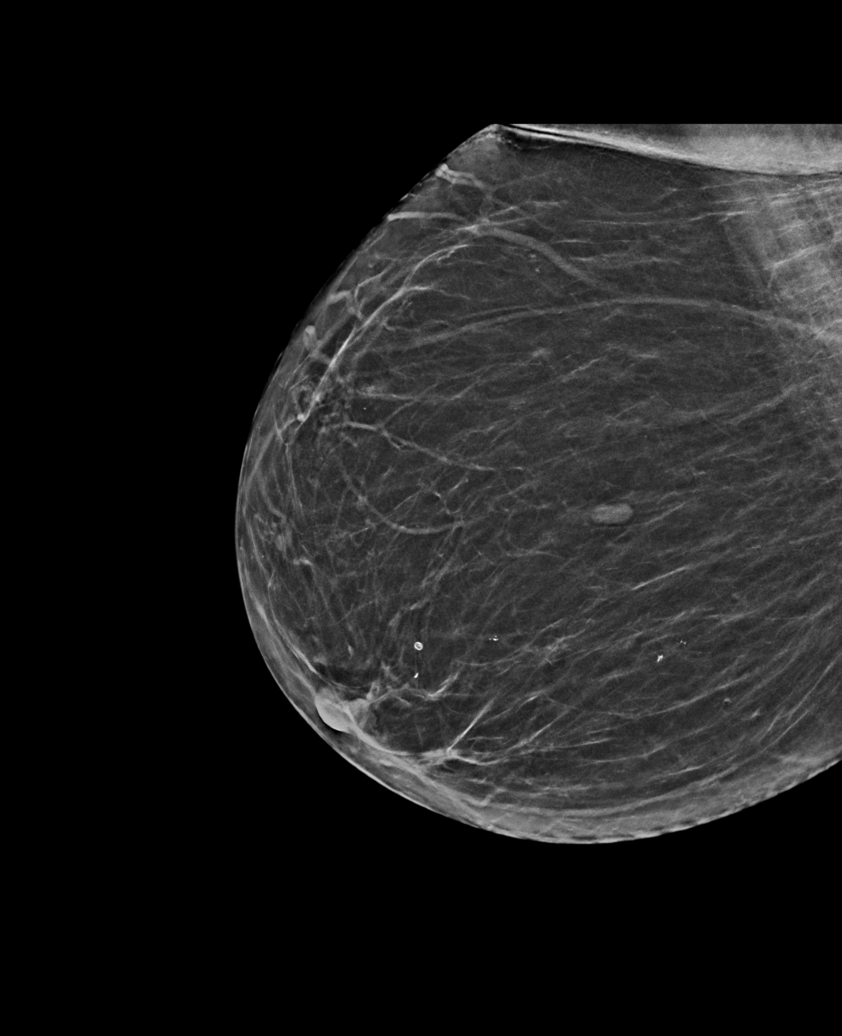

[L CC synth-2D (1 of 2)]
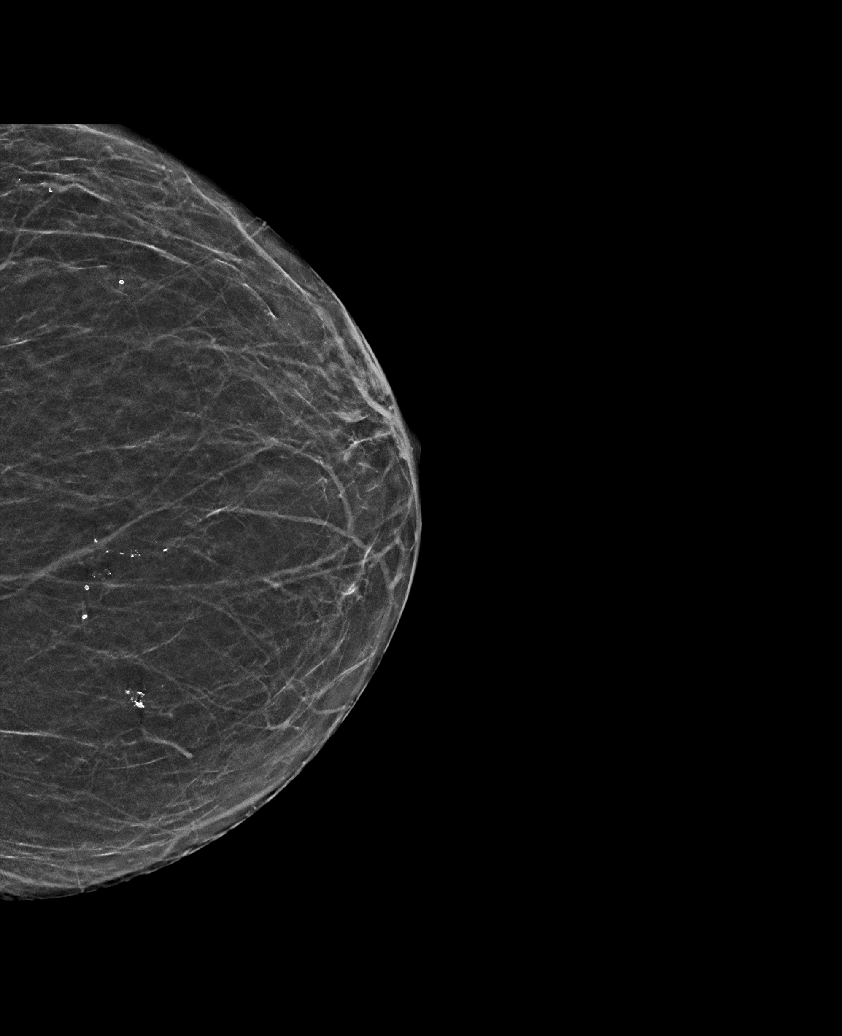

[L CC synth-2D (2 of 2)]
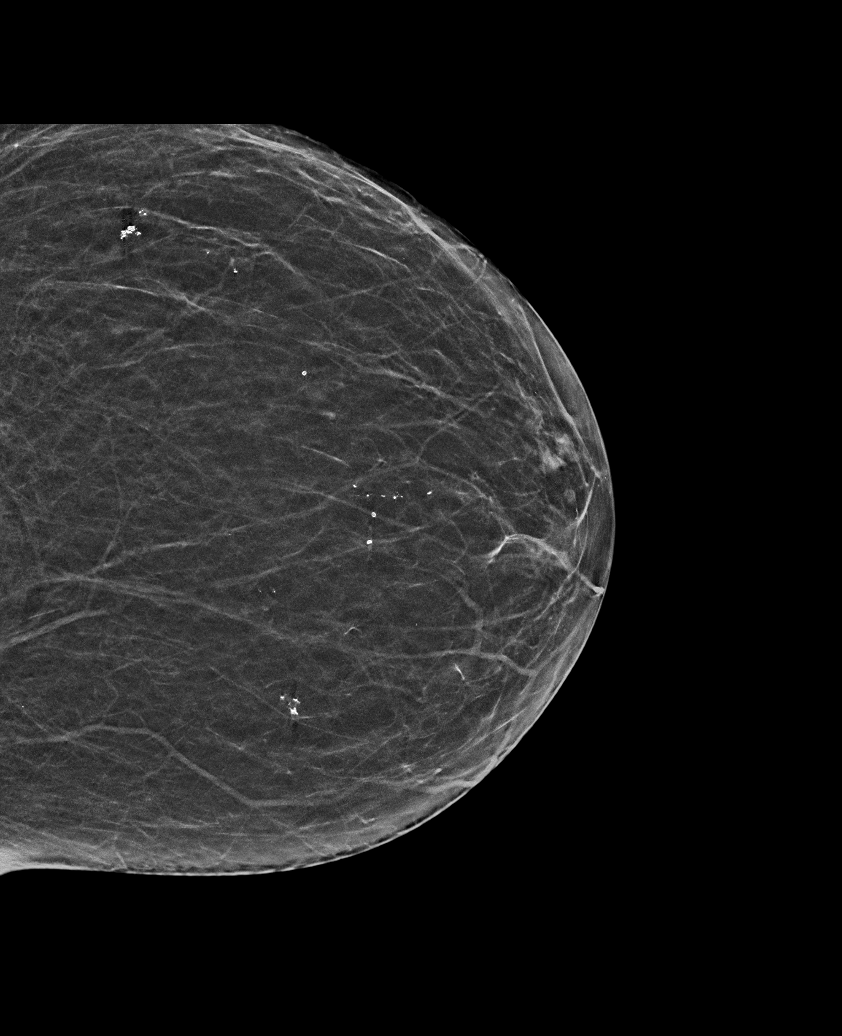

[R MLO synth-2D (2 of 2)]
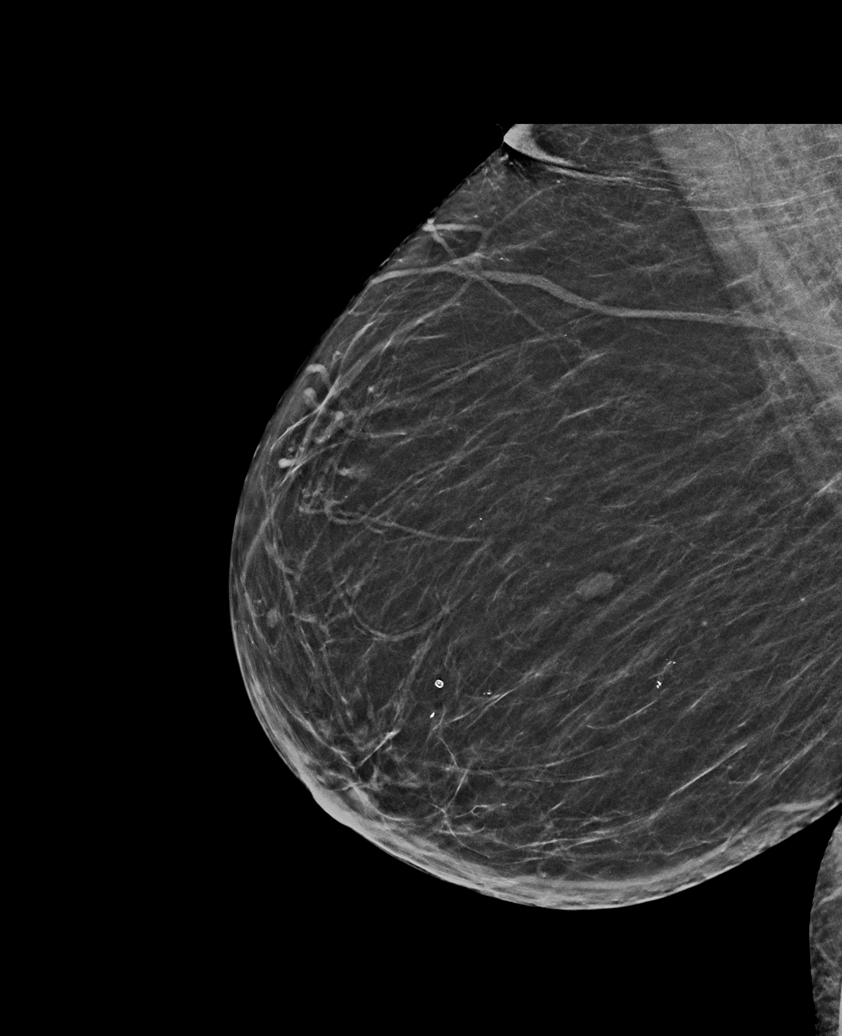

[6 of 36 positions shown; findings below may reference images not displayed]

FINDINGS: There are no findings suspicious for malignancy. Images were
processed with CAD.
IMPRESSION: No mammographic evidence of malignancy. A result letter of this
screening mammogram will be mailed directly to the patient.

RECOMMENDATION:
Screening mammogram in one year. (Code:[id number]-[id number])

BI-RADS CATEGORY  1: Negative.

## 2020-03-09 DIAGNOSIS — E871 Hypo-osmolality and hyponatremia: Secondary | ICD-10-CM | POA: Diagnosis not present

## 2020-03-09 DIAGNOSIS — Z20822 Contact with and (suspected) exposure to covid-19: Secondary | ICD-10-CM | POA: Diagnosis not present

## 2020-03-09 DIAGNOSIS — I517 Cardiomegaly: Secondary | ICD-10-CM | POA: Diagnosis not present

## 2020-03-09 DIAGNOSIS — R062 Wheezing: Secondary | ICD-10-CM | POA: Diagnosis not present

## 2020-03-09 DIAGNOSIS — J168 Pneumonia due to other specified infectious organisms: Secondary | ICD-10-CM | POA: Diagnosis not present

## 2020-03-09 DIAGNOSIS — I2699 Other pulmonary embolism without acute cor pulmonale: Secondary | ICD-10-CM | POA: Diagnosis not present

## 2020-03-09 DIAGNOSIS — J189 Pneumonia, unspecified organism: Secondary | ICD-10-CM | POA: Diagnosis not present

## 2020-03-09 DIAGNOSIS — I10 Essential (primary) hypertension: Secondary | ICD-10-CM | POA: Diagnosis not present

## 2020-03-09 DIAGNOSIS — E119 Type 2 diabetes mellitus without complications: Secondary | ICD-10-CM | POA: Diagnosis not present

## 2020-03-09 DIAGNOSIS — J9601 Acute respiratory failure with hypoxia: Secondary | ICD-10-CM | POA: Diagnosis not present

## 2020-03-09 DIAGNOSIS — Z720 Tobacco use: Secondary | ICD-10-CM | POA: Diagnosis not present

## 2020-03-09 DIAGNOSIS — R079 Chest pain, unspecified: Secondary | ICD-10-CM | POA: Diagnosis not present

## 2020-03-09 DIAGNOSIS — R0902 Hypoxemia: Secondary | ICD-10-CM | POA: Diagnosis not present

## 2020-03-09 DIAGNOSIS — I340 Nonrheumatic mitral (valve) insufficiency: Secondary | ICD-10-CM | POA: Diagnosis not present

## 2020-03-09 DIAGNOSIS — M7122 Synovial cyst of popliteal space [Baker], left knee: Secondary | ICD-10-CM | POA: Diagnosis not present

## 2020-03-09 DIAGNOSIS — J90 Pleural effusion, not elsewhere classified: Secondary | ICD-10-CM | POA: Diagnosis not present

## 2020-03-09 DIAGNOSIS — R0602 Shortness of breath: Secondary | ICD-10-CM | POA: Diagnosis not present

## 2020-03-09 DIAGNOSIS — E1140 Type 2 diabetes mellitus with diabetic neuropathy, unspecified: Secondary | ICD-10-CM | POA: Diagnosis not present

## 2020-03-09 DIAGNOSIS — S2220XA Unspecified fracture of sternum, initial encounter for closed fracture: Secondary | ICD-10-CM | POA: Diagnosis not present

## 2020-03-09 DIAGNOSIS — Z794 Long term (current) use of insulin: Secondary | ICD-10-CM | POA: Diagnosis not present

## 2020-03-10 DIAGNOSIS — R062 Wheezing: Secondary | ICD-10-CM | POA: Diagnosis not present

## 2020-03-10 DIAGNOSIS — S2220XA Unspecified fracture of sternum, initial encounter for closed fracture: Secondary | ICD-10-CM | POA: Diagnosis not present

## 2020-03-10 DIAGNOSIS — I2699 Other pulmonary embolism without acute cor pulmonale: Secondary | ICD-10-CM | POA: Diagnosis not present

## 2020-03-10 DIAGNOSIS — J189 Pneumonia, unspecified organism: Secondary | ICD-10-CM | POA: Diagnosis not present

## 2020-03-10 DIAGNOSIS — R0602 Shortness of breath: Secondary | ICD-10-CM | POA: Diagnosis not present

## 2020-03-10 DIAGNOSIS — I10 Essential (primary) hypertension: Secondary | ICD-10-CM | POA: Diagnosis not present

## 2020-03-10 DIAGNOSIS — Z20822 Contact with and (suspected) exposure to covid-19: Secondary | ICD-10-CM | POA: Diagnosis not present

## 2020-03-10 DIAGNOSIS — M7122 Synovial cyst of popliteal space [Baker], left knee: Secondary | ICD-10-CM | POA: Diagnosis not present

## 2020-03-10 DIAGNOSIS — J168 Pneumonia due to other specified infectious organisms: Secondary | ICD-10-CM | POA: Diagnosis not present

## 2020-03-10 DIAGNOSIS — R0902 Hypoxemia: Secondary | ICD-10-CM | POA: Diagnosis not present

## 2020-03-10 DIAGNOSIS — E119 Type 2 diabetes mellitus without complications: Secondary | ICD-10-CM | POA: Diagnosis not present

## 2020-03-10 DIAGNOSIS — E871 Hypo-osmolality and hyponatremia: Secondary | ICD-10-CM | POA: Diagnosis not present

## 2020-03-10 DIAGNOSIS — J90 Pleural effusion, not elsewhere classified: Secondary | ICD-10-CM | POA: Diagnosis not present

## 2020-03-10 DIAGNOSIS — J9601 Acute respiratory failure with hypoxia: Secondary | ICD-10-CM | POA: Diagnosis not present

## 2020-03-10 DIAGNOSIS — Z794 Long term (current) use of insulin: Secondary | ICD-10-CM | POA: Diagnosis not present

## 2020-03-10 DIAGNOSIS — E1140 Type 2 diabetes mellitus with diabetic neuropathy, unspecified: Secondary | ICD-10-CM | POA: Diagnosis not present

## 2020-03-21 DIAGNOSIS — Z6827 Body mass index (BMI) 27.0-27.9, adult: Secondary | ICD-10-CM | POA: Diagnosis not present

## 2020-03-21 DIAGNOSIS — J449 Chronic obstructive pulmonary disease, unspecified: Secondary | ICD-10-CM | POA: Diagnosis not present

## 2020-03-21 DIAGNOSIS — I272 Pulmonary hypertension, unspecified: Secondary | ICD-10-CM | POA: Diagnosis not present

## 2020-03-21 DIAGNOSIS — I2692 Saddle embolus of pulmonary artery without acute cor pulmonale: Secondary | ICD-10-CM | POA: Diagnosis not present

## 2020-04-15 DIAGNOSIS — J189 Pneumonia, unspecified organism: Secondary | ICD-10-CM | POA: Diagnosis not present

## 2020-04-15 DIAGNOSIS — I10 Essential (primary) hypertension: Secondary | ICD-10-CM | POA: Diagnosis not present

## 2020-04-15 DIAGNOSIS — E871 Hypo-osmolality and hyponatremia: Secondary | ICD-10-CM | POA: Diagnosis not present

## 2020-04-15 DIAGNOSIS — R0902 Hypoxemia: Secondary | ICD-10-CM | POA: Diagnosis not present

## 2020-05-15 DIAGNOSIS — R0902 Hypoxemia: Secondary | ICD-10-CM | POA: Diagnosis not present

## 2020-05-15 DIAGNOSIS — J189 Pneumonia, unspecified organism: Secondary | ICD-10-CM | POA: Diagnosis not present

## 2020-05-15 DIAGNOSIS — E871 Hypo-osmolality and hyponatremia: Secondary | ICD-10-CM | POA: Diagnosis not present

## 2020-05-15 DIAGNOSIS — I10 Essential (primary) hypertension: Secondary | ICD-10-CM | POA: Diagnosis not present

## 2020-06-21 DIAGNOSIS — J449 Chronic obstructive pulmonary disease, unspecified: Secondary | ICD-10-CM | POA: Diagnosis not present

## 2020-06-21 DIAGNOSIS — Z0000 Encounter for general adult medical examination without abnormal findings: Secondary | ICD-10-CM | POA: Diagnosis not present

## 2020-06-21 DIAGNOSIS — Z6827 Body mass index (BMI) 27.0-27.9, adult: Secondary | ICD-10-CM | POA: Diagnosis not present

## 2020-06-21 DIAGNOSIS — E1121 Type 2 diabetes mellitus with diabetic nephropathy: Secondary | ICD-10-CM | POA: Diagnosis not present

## 2020-06-21 DIAGNOSIS — I272 Pulmonary hypertension, unspecified: Secondary | ICD-10-CM | POA: Diagnosis not present

## 2020-06-21 DIAGNOSIS — I10 Essential (primary) hypertension: Secondary | ICD-10-CM | POA: Diagnosis not present

## 2020-06-21 DIAGNOSIS — I2692 Saddle embolus of pulmonary artery without acute cor pulmonale: Secondary | ICD-10-CM | POA: Diagnosis not present

## 2020-10-27 DIAGNOSIS — Z0000 Encounter for general adult medical examination without abnormal findings: Secondary | ICD-10-CM | POA: Diagnosis not present

## 2020-10-27 DIAGNOSIS — E1121 Type 2 diabetes mellitus with diabetic nephropathy: Secondary | ICD-10-CM | POA: Diagnosis not present

## 2020-10-27 DIAGNOSIS — Z6826 Body mass index (BMI) 26.0-26.9, adult: Secondary | ICD-10-CM | POA: Diagnosis not present

## 2020-10-27 DIAGNOSIS — J449 Chronic obstructive pulmonary disease, unspecified: Secondary | ICD-10-CM | POA: Diagnosis not present

## 2020-10-27 DIAGNOSIS — I10 Essential (primary) hypertension: Secondary | ICD-10-CM | POA: Diagnosis not present

## 2020-10-27 DIAGNOSIS — I272 Pulmonary hypertension, unspecified: Secondary | ICD-10-CM | POA: Diagnosis not present

## 2020-11-17 DIAGNOSIS — I10 Essential (primary) hypertension: Secondary | ICD-10-CM | POA: Diagnosis not present

## 2020-11-17 DIAGNOSIS — J449 Chronic obstructive pulmonary disease, unspecified: Secondary | ICD-10-CM | POA: Diagnosis not present

## 2020-11-17 DIAGNOSIS — E1121 Type 2 diabetes mellitus with diabetic nephropathy: Secondary | ICD-10-CM | POA: Diagnosis not present

## 2020-12-22 DIAGNOSIS — Z6823 Body mass index (BMI) 23.0-23.9, adult: Secondary | ICD-10-CM | POA: Diagnosis not present

## 2020-12-22 DIAGNOSIS — I272 Pulmonary hypertension, unspecified: Secondary | ICD-10-CM | POA: Diagnosis not present

## 2020-12-22 DIAGNOSIS — E1121 Type 2 diabetes mellitus with diabetic nephropathy: Secondary | ICD-10-CM | POA: Diagnosis not present

## 2020-12-22 DIAGNOSIS — I10 Essential (primary) hypertension: Secondary | ICD-10-CM | POA: Diagnosis not present

## 2020-12-22 DIAGNOSIS — J449 Chronic obstructive pulmonary disease, unspecified: Secondary | ICD-10-CM | POA: Diagnosis not present

## 2020-12-22 DIAGNOSIS — Z79899 Other long term (current) drug therapy: Secondary | ICD-10-CM | POA: Diagnosis not present

## 2021-01-09 DIAGNOSIS — Z87891 Personal history of nicotine dependence: Secondary | ICD-10-CM | POA: Diagnosis not present

## 2021-01-09 DIAGNOSIS — Z122 Encounter for screening for malignant neoplasm of respiratory organs: Secondary | ICD-10-CM | POA: Diagnosis not present

## 2021-01-09 DIAGNOSIS — F17210 Nicotine dependence, cigarettes, uncomplicated: Secondary | ICD-10-CM | POA: Diagnosis not present

## 2021-01-18 DIAGNOSIS — E1121 Type 2 diabetes mellitus with diabetic nephropathy: Secondary | ICD-10-CM | POA: Diagnosis not present

## 2021-01-18 DIAGNOSIS — J449 Chronic obstructive pulmonary disease, unspecified: Secondary | ICD-10-CM | POA: Diagnosis not present

## 2021-01-18 DIAGNOSIS — I10 Essential (primary) hypertension: Secondary | ICD-10-CM | POA: Diagnosis not present

## 2021-04-24 DIAGNOSIS — M545 Low back pain, unspecified: Secondary | ICD-10-CM | POA: Diagnosis not present

## 2021-04-24 DIAGNOSIS — M5416 Radiculopathy, lumbar region: Secondary | ICD-10-CM | POA: Diagnosis not present

## 2021-04-24 DIAGNOSIS — I10 Essential (primary) hypertension: Secondary | ICD-10-CM | POA: Diagnosis not present

## 2021-04-24 DIAGNOSIS — M5441 Lumbago with sciatica, right side: Secondary | ICD-10-CM | POA: Diagnosis not present

## 2021-04-24 DIAGNOSIS — G8929 Other chronic pain: Secondary | ICD-10-CM | POA: Diagnosis not present

## 2021-04-24 DIAGNOSIS — E1140 Type 2 diabetes mellitus with diabetic neuropathy, unspecified: Secondary | ICD-10-CM | POA: Diagnosis not present

## 2021-04-24 DIAGNOSIS — Z720 Tobacco use: Secondary | ICD-10-CM | POA: Diagnosis not present

## 2021-04-24 DIAGNOSIS — M4056 Lordosis, unspecified, lumbar region: Secondary | ICD-10-CM | POA: Diagnosis not present

## 2021-04-24 DIAGNOSIS — F17210 Nicotine dependence, cigarettes, uncomplicated: Secondary | ICD-10-CM | POA: Diagnosis not present

## 2021-04-24 DIAGNOSIS — M5116 Intervertebral disc disorders with radiculopathy, lumbar region: Secondary | ICD-10-CM | POA: Diagnosis not present

## 2021-04-27 DIAGNOSIS — M5441 Lumbago with sciatica, right side: Secondary | ICD-10-CM | POA: Diagnosis not present

## 2021-05-01 ENCOUNTER — Encounter (HOSPITAL_COMMUNITY): Payer: Self-pay | Admitting: *Deleted

## 2021-05-01 ENCOUNTER — Emergency Department (HOSPITAL_COMMUNITY)
Admission: EM | Admit: 2021-05-01 | Discharge: 2021-05-01 | Disposition: A | Discharge status: Home / Self Care | Payer: Medicare HMO | Attending: Emergency Medicine | Admitting: Emergency Medicine

## 2021-05-01 DIAGNOSIS — Z7984 Long term (current) use of oral hypoglycemic drugs: Secondary | ICD-10-CM | POA: Insufficient documentation

## 2021-05-01 DIAGNOSIS — Z7982 Long term (current) use of aspirin: Secondary | ICD-10-CM | POA: Insufficient documentation

## 2021-05-01 DIAGNOSIS — M545 Low back pain, unspecified: Secondary | ICD-10-CM | POA: Diagnosis not present

## 2021-05-01 DIAGNOSIS — M79604 Pain in right leg: Secondary | ICD-10-CM | POA: Diagnosis not present

## 2021-05-01 DIAGNOSIS — G8929 Other chronic pain: Secondary | ICD-10-CM | POA: Diagnosis not present

## 2021-05-01 DIAGNOSIS — E1122 Type 2 diabetes mellitus with diabetic chronic kidney disease: Secondary | ICD-10-CM | POA: Insufficient documentation

## 2021-05-01 DIAGNOSIS — E1140 Type 2 diabetes mellitus with diabetic neuropathy, unspecified: Secondary | ICD-10-CM | POA: Insufficient documentation

## 2021-05-01 DIAGNOSIS — I129 Hypertensive chronic kidney disease with stage 1 through stage 4 chronic kidney disease, or unspecified chronic kidney disease: Secondary | ICD-10-CM | POA: Insufficient documentation

## 2021-05-01 DIAGNOSIS — Z79899 Other long term (current) drug therapy: Secondary | ICD-10-CM | POA: Insufficient documentation

## 2021-05-01 DIAGNOSIS — N189 Chronic kidney disease, unspecified: Secondary | ICD-10-CM | POA: Insufficient documentation

## 2021-05-01 DIAGNOSIS — F17210 Nicotine dependence, cigarettes, uncomplicated: Secondary | ICD-10-CM | POA: Insufficient documentation

## 2021-05-01 DIAGNOSIS — Z794 Long term (current) use of insulin: Secondary | ICD-10-CM | POA: Diagnosis not present

## 2021-05-01 MED ORDER — METHOCARBAMOL 500 MG PO TABS: 500.0000 mg | ORAL_TABLET | Freq: Four times a day (QID) | ORAL | 0 refills | Status: AC

## 2021-05-01 MED ORDER — MELOXICAM 15 MG PO TABS: 15.0000 mg | ORAL_TABLET | Freq: Every day | ORAL | 0 refills | Status: AC

## 2021-05-01 NOTE — ED Triage Notes (Signed)
Right leg pain x 4 months

## 2021-05-01 NOTE — ED Provider Notes (Signed)
[facility] EMERGENCY DEPARTMENT Provider Note   CSN: [id number] Arrival date & time: 05/01/21  1323     History Chief Complaint  Patient presents with   Leg Pain    Natalie Vaughan is a 60 y.o. female.  Pt complains of pain in her low back and pain that shoots down her right leg.  Pt reports she was seen at [facility] 7 days ago and given an injection for symptoms patient reports that she had an x-ray at that time.  Patient reports she had some initial relief but pain has returned.  Patient reports she has had this pain in her back for several months.  She reports no relief with over-the-counter medications.  Patient reports she has seen her physician for the same x-ray from 12 5 reviewed and patient counseled on symptoms and on symptoms she is given a prescription for  The history is provided by the patient. No language interpreter was used.  Leg Pain Pain details:    Quality:  Aching   Onset quality:  Gradual   Duration:  4 months   Timing:  Constant   Progression:  Worsening     Past Medical History:  Diagnosis Date   Anxiety    Depression    Diabetes mellitus without complication ([place])    Neuropathy     Patient Active Problem List   Diagnosis Date Noted   Chronic kidney disease 04/25/2016   Cigarette nicotine dependence without complication 88/89/1694   Type 2 diabetes mellitus with complication ([place]) 50/38/8828   Essential hypertension, benign 05/04/2015   Hyperlipidemia 05/04/2015   Obesity 05/04/2015   Diabetic polyneuropathy associated with type 2 diabetes mellitus ([place]) 05/04/2015   Depression 03/13/2014    Past Surgical History:  Procedure Laterality Date   BREAST LUMPECTOMY Bilateral age 62   benign     OB History   No obstetric history on file.     Family History  Problem Relation Age of Onset   Heart disease Mother    Diabetes Maternal Grandmother     Social History   Tobacco Use   Smoking status: Some Days    Packs/day: 0.50    Years:  20.00    Pack years: 10.00    Types: Cigarettes   Smokeless tobacco: Never  Substance Use Topics   Alcohol use: Yes    Alcohol/week: 2.0 standard drinks    Types: 2 Cans of beer per week   Drug use: No    Home Medications Prior to Admission medications   Medication Sig Start Date End Date Taking? Authorizing Provider  meloxicam (MOBIC) 15 MG tablet Take 1 tablet (15 mg total) by mouth daily. 05/01/21  Yes Caryl Ada K, PA-C  methocarbamol (ROBAXIN) 500 MG tablet Take 1 tablet (500 mg total) by mouth 4 (four) times daily. 05/01/21  Yes Caryl Ada K, PA-C  acetaminophen (TYLENOL) 500 MG chewable tablet Chew 1,000 mg by mouth every 6 (six) hours as needed for pain.    [provider]  aspirin EC 325 MG tablet Take 1 tablet (325 mg total) by mouth daily. Patient taking differently: Take 81 mg by mouth daily.  03/16/14   Niel Hummer, NP  atorvastatin (LIPITOR) 20 MG tablet TAKE 1 Tablet BY MOUTH ONCE DAILY 12/20/15   Soyla Dryer, PA-C  citalopram (CELEXA) 20 MG tablet Take 1 tablet (20 mg total) by mouth daily. 01/26/16   Soyla Dryer, PA-C  doxepin (SINEQUAN) 10 MG capsule Take 10 mg by mouth at  bedtime as needed.     [provider]  gabapentin (NEURONTIN) 300 MG capsule TAKE 4 Capsules  BY MOUTH TWICE DAILY 02/16/16   Soyla Dryer, PA-C  hydrochlorothiazide (HYDRODIURIL) 25 MG tablet TAKE 1 Tablet BY MOUTH ONCE DAILY FOR BLOOD PRESSURE 01/26/16   Soyla Dryer, PA-C  Insulin Glargine (LANTUS SOLOSTAR) 100 UNIT/ML Solostar Pen Inject 20 Units into the skin daily at 10 pm. Patient taking differently: Inject 18 Units into the skin daily at 10 pm.  03/16/14   Niel Hummer, NP  lisinopril (PRINIVIL,ZESTRIL) 20 MG tablet Take 1 tablet (20 mg total) by mouth daily. 02/16/16   Soyla Dryer, PA-C  metFORMIN (GLUCOPHAGE) 1000 MG tablet TAKE 1 Tablet  BY MOUTH TWICE DAILY FOR DIABETES 02/16/16   Soyla Dryer, PA-C  metoprolol tartrate (LOPRESSOR) 25 MG  tablet TAKE 1 Tablet  BY MOUTH TWICE DAILY FOR BLOOD PRESSURE 02/16/16   Soyla Dryer, PA-C    Allergies    Patient has no known allergies.  Review of Systems   Review of Systems  All other systems reviewed and are negative.  Physical Exam Updated Vital Signs BP (!) 190/90 (BP Location: Right Arm)   Pulse 73   Temp 98.3 F (36.8 C)   Resp 16   SpO2 91%   Physical Exam Vitals and nursing note reviewed.  Constitutional:      Appearance: She is well-developed.  HENT:     Head: Normocephalic.  Cardiovascular:     Rate and Rhythm: Normal rate.     Pulses: Normal pulses.  Pulmonary:     Effort: Pulmonary effort is normal.  Abdominal:     General: Abdomen is flat. There is no distension.  Musculoskeletal:        General: Normal range of motion.     Cervical back: Normal range of motion.     Comments: Diffusely tender ls spine,  pain with range of motion   Skin:    General: Skin is warm.  Neurological:     General: No focal deficit present.     Mental Status: She is alert and oriented to person, place, and time.    ED Results / Procedures / Treatments   Labs (all labs ordered are listed, but only abnormal results are displayed) Labs Reviewed - No data to display  EKG None  Radiology No results found.  Procedures Procedures   Medications Ordered in ED Medications - No data to display  ED Course  I have reviewed the triage vital signs and the nursing notes.  Pertinent labs & imaging results that were available during my care of the patient were reviewed by me and considered in my medical decision making (see chart for details).    MDM Rules/Calculators/A&P                           MDM: X-ray on record from [facility] reviewed  Final Clinical Impression(s) / ED Diagnoses Final diagnoses:  Chronic low back pain, unspecified back pain laterality, unspecified whether sciatica present    Rx / DC Orders ED Discharge Orders          Ordered     meloxicam (MOBIC) 15 MG tablet  Daily        05/01/21 1554    methocarbamol (ROBAXIN) 500 MG tablet  4 times daily        05/01/21 1554          An After Visit  Summary was printed and given to the patient.    Sidney Ace 05/01/21 2232    Hayden Rasmussen, MD 05/02/21 [phone]

## 2021-05-01 NOTE — Discharge Instructions (Addendum)
Schedule to see your Physician forrecheck

## 2021-05-08 DIAGNOSIS — Z6823 Body mass index (BMI) 23.0-23.9, adult: Secondary | ICD-10-CM | POA: Diagnosis not present

## 2021-05-08 DIAGNOSIS — M5459 Other low back pain: Secondary | ICD-10-CM | POA: Diagnosis not present

## 2021-05-19 DIAGNOSIS — J449 Chronic obstructive pulmonary disease, unspecified: Secondary | ICD-10-CM | POA: Diagnosis not present

## 2021-05-19 DIAGNOSIS — E1129 Type 2 diabetes mellitus with other diabetic kidney complication: Secondary | ICD-10-CM | POA: Diagnosis not present

## 2021-05-19 DIAGNOSIS — I10 Essential (primary) hypertension: Secondary | ICD-10-CM | POA: Diagnosis not present

## 2021-05-24 DIAGNOSIS — M47816 Spondylosis without myelopathy or radiculopathy, lumbar region: Secondary | ICD-10-CM | POA: Diagnosis not present

## 2021-05-24 DIAGNOSIS — M48061 Spinal stenosis, lumbar region without neurogenic claudication: Secondary | ICD-10-CM | POA: Diagnosis not present

## 2021-05-24 DIAGNOSIS — M5136 Other intervertebral disc degeneration, lumbar region: Secondary | ICD-10-CM | POA: Diagnosis not present

## 2021-07-10 DIAGNOSIS — J449 Chronic obstructive pulmonary disease, unspecified: Secondary | ICD-10-CM | POA: Diagnosis not present

## 2021-07-10 DIAGNOSIS — E782 Mixed hyperlipidemia: Secondary | ICD-10-CM | POA: Diagnosis not present

## 2021-07-10 DIAGNOSIS — Z0000 Encounter for general adult medical examination without abnormal findings: Secondary | ICD-10-CM | POA: Diagnosis not present

## 2021-07-10 DIAGNOSIS — I700 Atherosclerosis of aorta: Secondary | ICD-10-CM | POA: Diagnosis not present

## 2021-07-10 DIAGNOSIS — M5459 Other low back pain: Secondary | ICD-10-CM | POA: Diagnosis not present

## 2021-07-10 DIAGNOSIS — Z6822 Body mass index (BMI) 22.0-22.9, adult: Secondary | ICD-10-CM | POA: Diagnosis not present

## 2021-07-10 DIAGNOSIS — I272 Pulmonary hypertension, unspecified: Secondary | ICD-10-CM | POA: Diagnosis not present

## 2021-07-10 DIAGNOSIS — I10 Essential (primary) hypertension: Secondary | ICD-10-CM | POA: Diagnosis not present

## 2021-07-10 DIAGNOSIS — E1122 Type 2 diabetes mellitus with diabetic chronic kidney disease: Secondary | ICD-10-CM | POA: Diagnosis not present

## 2021-07-10 DIAGNOSIS — E1121 Type 2 diabetes mellitus with diabetic nephropathy: Secondary | ICD-10-CM | POA: Diagnosis not present

## 2021-08-29 DIAGNOSIS — H35033 Hypertensive retinopathy, bilateral: Secondary | ICD-10-CM | POA: Diagnosis not present

## 2021-10-10 DIAGNOSIS — I700 Atherosclerosis of aorta: Secondary | ICD-10-CM | POA: Diagnosis not present

## 2021-10-10 DIAGNOSIS — E782 Mixed hyperlipidemia: Secondary | ICD-10-CM | POA: Diagnosis not present

## 2021-10-10 DIAGNOSIS — G4733 Obstructive sleep apnea (adult) (pediatric): Secondary | ICD-10-CM | POA: Diagnosis not present

## 2021-10-10 DIAGNOSIS — J449 Chronic obstructive pulmonary disease, unspecified: Secondary | ICD-10-CM | POA: Diagnosis not present

## 2021-10-10 DIAGNOSIS — M5430 Sciatica, unspecified side: Secondary | ICD-10-CM | POA: Diagnosis not present

## 2021-10-10 DIAGNOSIS — I2699 Other pulmonary embolism without acute cor pulmonale: Secondary | ICD-10-CM | POA: Diagnosis not present

## 2021-10-10 DIAGNOSIS — I10 Essential (primary) hypertension: Secondary | ICD-10-CM | POA: Diagnosis not present

## 2021-10-10 DIAGNOSIS — E1121 Type 2 diabetes mellitus with diabetic nephropathy: Secondary | ICD-10-CM | POA: Diagnosis not present

## 2021-10-10 DIAGNOSIS — Z6821 Body mass index (BMI) 21.0-21.9, adult: Secondary | ICD-10-CM | POA: Diagnosis not present

## 2022-01-16 DIAGNOSIS — G4733 Obstructive sleep apnea (adult) (pediatric): Secondary | ICD-10-CM | POA: Diagnosis not present

## 2022-01-16 DIAGNOSIS — J449 Chronic obstructive pulmonary disease, unspecified: Secondary | ICD-10-CM | POA: Diagnosis not present

## 2022-01-16 DIAGNOSIS — I10 Essential (primary) hypertension: Secondary | ICD-10-CM | POA: Diagnosis not present

## 2022-01-16 DIAGNOSIS — Z681 Body mass index (BMI) 19 or less, adult: Secondary | ICD-10-CM | POA: Diagnosis not present

## 2022-01-16 DIAGNOSIS — I700 Atherosclerosis of aorta: Secondary | ICD-10-CM | POA: Diagnosis not present

## 2022-01-16 DIAGNOSIS — E782 Mixed hyperlipidemia: Secondary | ICD-10-CM | POA: Diagnosis not present

## 2022-01-16 DIAGNOSIS — I2699 Other pulmonary embolism without acute cor pulmonale: Secondary | ICD-10-CM | POA: Diagnosis not present

## 2022-01-16 DIAGNOSIS — E1121 Type 2 diabetes mellitus with diabetic nephropathy: Secondary | ICD-10-CM | POA: Diagnosis not present

## 2022-01-16 DIAGNOSIS — Z0000 Encounter for general adult medical examination without abnormal findings: Secondary | ICD-10-CM | POA: Diagnosis not present

## 2022-01-16 DIAGNOSIS — M5430 Sciatica, unspecified side: Secondary | ICD-10-CM | POA: Diagnosis not present

## 2022-05-22 DIAGNOSIS — R69 Illness, unspecified: Secondary | ICD-10-CM | POA: Diagnosis not present

## 2022-05-22 DIAGNOSIS — F17210 Nicotine dependence, cigarettes, uncomplicated: Secondary | ICD-10-CM | POA: Diagnosis not present

## 2022-07-19 DIAGNOSIS — J449 Chronic obstructive pulmonary disease, unspecified: Secondary | ICD-10-CM | POA: Diagnosis not present

## 2022-07-19 DIAGNOSIS — M5431 Sciatica, right side: Secondary | ICD-10-CM | POA: Diagnosis not present

## 2022-07-19 DIAGNOSIS — Z0000 Encounter for general adult medical examination without abnormal findings: Secondary | ICD-10-CM | POA: Diagnosis not present

## 2022-07-19 DIAGNOSIS — I10 Essential (primary) hypertension: Secondary | ICD-10-CM | POA: Diagnosis not present

## 2022-07-19 DIAGNOSIS — E1121 Type 2 diabetes mellitus with diabetic nephropathy: Secondary | ICD-10-CM | POA: Diagnosis not present

## 2022-07-19 DIAGNOSIS — G4733 Obstructive sleep apnea (adult) (pediatric): Secondary | ICD-10-CM | POA: Diagnosis not present

## 2022-07-19 DIAGNOSIS — I700 Atherosclerosis of aorta: Secondary | ICD-10-CM | POA: Diagnosis not present

## 2022-07-19 DIAGNOSIS — Z6820 Body mass index (BMI) 20.0-20.9, adult: Secondary | ICD-10-CM | POA: Diagnosis not present

## 2022-07-19 DIAGNOSIS — I2699 Other pulmonary embolism without acute cor pulmonale: Secondary | ICD-10-CM | POA: Diagnosis not present

## 2022-07-19 DIAGNOSIS — E782 Mixed hyperlipidemia: Secondary | ICD-10-CM | POA: Diagnosis not present

## 2022-07-24 DIAGNOSIS — Z122 Encounter for screening for malignant neoplasm of respiratory organs: Secondary | ICD-10-CM | POA: Diagnosis not present

## 2022-07-24 DIAGNOSIS — R69 Illness, unspecified: Secondary | ICD-10-CM | POA: Diagnosis not present

## 2022-08-09 DIAGNOSIS — Z1231 Encounter for screening mammogram for malignant neoplasm of breast: Secondary | ICD-10-CM | POA: Diagnosis not present

## 2022-08-13 DIAGNOSIS — R911 Solitary pulmonary nodule: Secondary | ICD-10-CM | POA: Diagnosis not present

## 2022-08-13 DIAGNOSIS — M948X1 Other specified disorders of cartilage, shoulder: Secondary | ICD-10-CM | POA: Diagnosis not present

## 2022-08-13 DIAGNOSIS — J323 Chronic sphenoidal sinusitis: Secondary | ICD-10-CM | POA: Diagnosis not present

## 2022-08-13 DIAGNOSIS — I700 Atherosclerosis of aorta: Secondary | ICD-10-CM | POA: Diagnosis not present

## 2022-08-13 DIAGNOSIS — M4306 Spondylolysis, lumbar region: Secondary | ICD-10-CM | POA: Diagnosis not present

## 2022-08-13 DIAGNOSIS — M5136 Other intervertebral disc degeneration, lumbar region: Secondary | ICD-10-CM | POA: Diagnosis not present

## 2022-08-13 DIAGNOSIS — J439 Emphysema, unspecified: Secondary | ICD-10-CM | POA: Diagnosis not present

## 2022-11-02 DIAGNOSIS — I129 Hypertensive chronic kidney disease with stage 1 through stage 4 chronic kidney disease, or unspecified chronic kidney disease: Secondary | ICD-10-CM | POA: Diagnosis not present

## 2022-11-02 DIAGNOSIS — N182 Chronic kidney disease, stage 2 (mild): Secondary | ICD-10-CM | POA: Diagnosis not present

## 2022-11-02 DIAGNOSIS — E46 Unspecified protein-calorie malnutrition: Secondary | ICD-10-CM | POA: Diagnosis not present

## 2022-11-02 DIAGNOSIS — F419 Anxiety disorder, unspecified: Secondary | ICD-10-CM | POA: Diagnosis not present

## 2022-11-02 DIAGNOSIS — Z809 Family history of malignant neoplasm, unspecified: Secondary | ICD-10-CM | POA: Diagnosis not present

## 2022-11-02 DIAGNOSIS — F320 Major depressive disorder, single episode, mild: Secondary | ICD-10-CM | POA: Diagnosis not present

## 2022-11-02 DIAGNOSIS — J439 Emphysema, unspecified: Secondary | ICD-10-CM | POA: Diagnosis not present

## 2022-11-02 DIAGNOSIS — Z8249 Family history of ischemic heart disease and other diseases of the circulatory system: Secondary | ICD-10-CM | POA: Diagnosis not present

## 2022-11-02 DIAGNOSIS — I700 Atherosclerosis of aorta: Secondary | ICD-10-CM | POA: Diagnosis not present

## 2022-11-02 DIAGNOSIS — E785 Hyperlipidemia, unspecified: Secondary | ICD-10-CM | POA: Diagnosis not present

## 2022-11-02 DIAGNOSIS — Z7984 Long term (current) use of oral hypoglycemic drugs: Secondary | ICD-10-CM | POA: Diagnosis not present

## 2022-11-02 DIAGNOSIS — M1990 Unspecified osteoarthritis, unspecified site: Secondary | ICD-10-CM | POA: Diagnosis not present

## 2022-11-05 DIAGNOSIS — M810 Age-related osteoporosis without current pathological fracture: Secondary | ICD-10-CM | POA: Diagnosis not present

## 2022-11-05 DIAGNOSIS — Z780 Asymptomatic menopausal state: Secondary | ICD-10-CM | POA: Diagnosis not present

## 2022-11-14 DIAGNOSIS — G4733 Obstructive sleep apnea (adult) (pediatric): Secondary | ICD-10-CM | POA: Diagnosis not present

## 2022-11-14 DIAGNOSIS — Z681 Body mass index (BMI) 19 or less, adult: Secondary | ICD-10-CM | POA: Diagnosis not present

## 2022-11-14 DIAGNOSIS — G20C Parkinsonism, unspecified: Secondary | ICD-10-CM | POA: Diagnosis not present

## 2022-11-14 DIAGNOSIS — I10 Essential (primary) hypertension: Secondary | ICD-10-CM | POA: Diagnosis not present

## 2022-11-14 DIAGNOSIS — M5431 Sciatica, right side: Secondary | ICD-10-CM | POA: Diagnosis not present

## 2022-11-14 DIAGNOSIS — I700 Atherosclerosis of aorta: Secondary | ICD-10-CM | POA: Diagnosis not present

## 2022-11-14 DIAGNOSIS — Z0000 Encounter for general adult medical examination without abnormal findings: Secondary | ICD-10-CM | POA: Diagnosis not present

## 2022-11-14 DIAGNOSIS — M5432 Sciatica, left side: Secondary | ICD-10-CM | POA: Diagnosis not present

## 2022-11-14 DIAGNOSIS — E1121 Type 2 diabetes mellitus with diabetic nephropathy: Secondary | ICD-10-CM | POA: Diagnosis not present

## 2022-11-14 DIAGNOSIS — E782 Mixed hyperlipidemia: Secondary | ICD-10-CM | POA: Diagnosis not present

## 2023-02-20 DIAGNOSIS — G4733 Obstructive sleep apnea (adult) (pediatric): Secondary | ICD-10-CM | POA: Diagnosis not present

## 2023-02-20 DIAGNOSIS — M5431 Sciatica, right side: Secondary | ICD-10-CM | POA: Diagnosis not present

## 2023-02-20 DIAGNOSIS — E1121 Type 2 diabetes mellitus with diabetic nephropathy: Secondary | ICD-10-CM | POA: Diagnosis not present

## 2023-02-20 DIAGNOSIS — Z0000 Encounter for general adult medical examination without abnormal findings: Secondary | ICD-10-CM | POA: Diagnosis not present

## 2023-02-20 DIAGNOSIS — Z681 Body mass index (BMI) 19 or less, adult: Secondary | ICD-10-CM | POA: Diagnosis not present

## 2023-02-20 DIAGNOSIS — I10 Essential (primary) hypertension: Secondary | ICD-10-CM | POA: Diagnosis not present

## 2023-02-20 DIAGNOSIS — J449 Chronic obstructive pulmonary disease, unspecified: Secondary | ICD-10-CM | POA: Diagnosis not present

## 2023-02-20 DIAGNOSIS — M5432 Sciatica, left side: Secondary | ICD-10-CM | POA: Diagnosis not present

## 2023-02-20 DIAGNOSIS — E782 Mixed hyperlipidemia: Secondary | ICD-10-CM | POA: Diagnosis not present

## 2023-02-20 DIAGNOSIS — I700 Atherosclerosis of aorta: Secondary | ICD-10-CM | POA: Diagnosis not present

## 2023-05-29 DIAGNOSIS — M5432 Sciatica, left side: Secondary | ICD-10-CM | POA: Diagnosis not present

## 2023-05-29 DIAGNOSIS — G4733 Obstructive sleep apnea (adult) (pediatric): Secondary | ICD-10-CM | POA: Diagnosis not present

## 2023-05-29 DIAGNOSIS — I700 Atherosclerosis of aorta: Secondary | ICD-10-CM | POA: Diagnosis not present

## 2023-05-29 DIAGNOSIS — I10 Essential (primary) hypertension: Secondary | ICD-10-CM | POA: Diagnosis not present

## 2023-05-29 DIAGNOSIS — N1831 Chronic kidney disease, stage 3a: Secondary | ICD-10-CM | POA: Diagnosis not present

## 2023-05-29 DIAGNOSIS — E782 Mixed hyperlipidemia: Secondary | ICD-10-CM | POA: Diagnosis not present

## 2023-05-29 DIAGNOSIS — M5431 Sciatica, right side: Secondary | ICD-10-CM | POA: Diagnosis not present

## 2023-05-29 DIAGNOSIS — J449 Chronic obstructive pulmonary disease, unspecified: Secondary | ICD-10-CM | POA: Diagnosis not present

## 2023-05-29 DIAGNOSIS — Z6820 Body mass index (BMI) 20.0-20.9, adult: Secondary | ICD-10-CM | POA: Diagnosis not present

## 2023-05-29 DIAGNOSIS — E1122 Type 2 diabetes mellitus with diabetic chronic kidney disease: Secondary | ICD-10-CM | POA: Diagnosis not present

## 2023-09-05 DIAGNOSIS — Z6820 Body mass index (BMI) 20.0-20.9, adult: Secondary | ICD-10-CM | POA: Diagnosis not present

## 2023-09-05 DIAGNOSIS — E782 Mixed hyperlipidemia: Secondary | ICD-10-CM | POA: Diagnosis not present

## 2023-09-05 DIAGNOSIS — M5431 Sciatica, right side: Secondary | ICD-10-CM | POA: Diagnosis not present

## 2023-09-05 DIAGNOSIS — Z0000 Encounter for general adult medical examination without abnormal findings: Secondary | ICD-10-CM | POA: Diagnosis not present

## 2023-09-05 DIAGNOSIS — J449 Chronic obstructive pulmonary disease, unspecified: Secondary | ICD-10-CM | POA: Diagnosis not present

## 2023-09-05 DIAGNOSIS — E1122 Type 2 diabetes mellitus with diabetic chronic kidney disease: Secondary | ICD-10-CM | POA: Diagnosis not present

## 2023-09-05 DIAGNOSIS — I10 Essential (primary) hypertension: Secondary | ICD-10-CM | POA: Diagnosis not present

## 2023-09-05 DIAGNOSIS — N1831 Chronic kidney disease, stage 3a: Secondary | ICD-10-CM | POA: Diagnosis not present

## 2023-09-05 DIAGNOSIS — M5432 Sciatica, left side: Secondary | ICD-10-CM | POA: Diagnosis not present

## 2023-09-05 DIAGNOSIS — I700 Atherosclerosis of aorta: Secondary | ICD-10-CM | POA: Diagnosis not present

## 2023-09-05 DIAGNOSIS — G4733 Obstructive sleep apnea (adult) (pediatric): Secondary | ICD-10-CM | POA: Diagnosis not present

## 2023-09-26 DIAGNOSIS — F17210 Nicotine dependence, cigarettes, uncomplicated: Secondary | ICD-10-CM | POA: Diagnosis not present

## 2023-09-26 DIAGNOSIS — R911 Solitary pulmonary nodule: Secondary | ICD-10-CM | POA: Diagnosis not present

## 2023-09-26 DIAGNOSIS — Z87891 Personal history of nicotine dependence: Secondary | ICD-10-CM | POA: Diagnosis not present

## 2023-12-18 DIAGNOSIS — I700 Atherosclerosis of aorta: Secondary | ICD-10-CM | POA: Diagnosis not present

## 2023-12-18 DIAGNOSIS — N182 Chronic kidney disease, stage 2 (mild): Secondary | ICD-10-CM | POA: Diagnosis not present

## 2023-12-18 DIAGNOSIS — E1122 Type 2 diabetes mellitus with diabetic chronic kidney disease: Secondary | ICD-10-CM | POA: Diagnosis not present

## 2023-12-18 DIAGNOSIS — M5432 Sciatica, left side: Secondary | ICD-10-CM | POA: Diagnosis not present

## 2023-12-18 DIAGNOSIS — Z0000 Encounter for general adult medical examination without abnormal findings: Secondary | ICD-10-CM | POA: Diagnosis not present

## 2023-12-18 DIAGNOSIS — J449 Chronic obstructive pulmonary disease, unspecified: Secondary | ICD-10-CM | POA: Diagnosis not present

## 2023-12-18 DIAGNOSIS — R252 Cramp and spasm: Secondary | ICD-10-CM | POA: Diagnosis not present

## 2023-12-18 DIAGNOSIS — M5431 Sciatica, right side: Secondary | ICD-10-CM | POA: Diagnosis not present

## 2023-12-18 DIAGNOSIS — E782 Mixed hyperlipidemia: Secondary | ICD-10-CM | POA: Diagnosis not present

## 2023-12-18 DIAGNOSIS — Z6820 Body mass index (BMI) 20.0-20.9, adult: Secondary | ICD-10-CM | POA: Diagnosis not present

## 2023-12-18 DIAGNOSIS — I10 Essential (primary) hypertension: Secondary | ICD-10-CM | POA: Diagnosis not present

## 2023-12-31 DIAGNOSIS — N182 Chronic kidney disease, stage 2 (mild): Secondary | ICD-10-CM | POA: Diagnosis not present

## 2023-12-31 DIAGNOSIS — Z87891 Personal history of nicotine dependence: Secondary | ICD-10-CM | POA: Diagnosis not present

## 2023-12-31 DIAGNOSIS — R911 Solitary pulmonary nodule: Secondary | ICD-10-CM | POA: Diagnosis not present
